# Patient Record
Sex: Female | Born: 1965 | Race: White | Hispanic: No | Marital: Married | Smoking: Former smoker
Health system: Southern US, Community
[De-identification: ages and names within clinical notes are randomized; demographics above are authoritative.]

## PROBLEM LIST (undated history)

## (undated) DIAGNOSIS — M797 Fibromyalgia: Secondary | ICD-10-CM

## (undated) DIAGNOSIS — M519 Unspecified thoracic, thoracolumbar and lumbosacral intervertebral disc disorder: Secondary | ICD-10-CM

## (undated) DIAGNOSIS — K224 Dyskinesia of esophagus: Secondary | ICD-10-CM

## (undated) DIAGNOSIS — D649 Anemia, unspecified: Secondary | ICD-10-CM

## (undated) DIAGNOSIS — N893 Dysplasia of vagina, unspecified: Secondary | ICD-10-CM

## (undated) DIAGNOSIS — G43909 Migraine, unspecified, not intractable, without status migrainosus: Secondary | ICD-10-CM

## (undated) HISTORY — PX: HEMORRHOID SURGERY: SHX153

## (undated) HISTORY — DX: Dyskinesia of esophagus: K22.4

## (undated) HISTORY — PX: TUBAL LIGATION: SHX77

## (undated) HISTORY — PX: APPENDECTOMY: SHX54

## (undated) HISTORY — DX: Fibromyalgia: M79.7

## (undated) HISTORY — PX: CERVICAL FUSION: SHX112

## (undated) HISTORY — DX: Unspecified thoracic, thoracolumbar and lumbosacral intervertebral disc disorder: M51.9

## (undated) HISTORY — DX: Migraine, unspecified, not intractable, without status migrainosus: G43.909

## (undated) HISTORY — PX: NASAL SEPTUM SURGERY: SHX37

---

## 1999-04-08 ENCOUNTER — Other Ambulatory Visit: Admission: RE | Admit: 1999-04-08 | Discharge: 1999-04-08 | Payer: Self-pay | Admitting: Obstetrics and Gynecology

## 2000-04-22 ENCOUNTER — Other Ambulatory Visit: Admission: RE | Admit: 2000-04-22 | Discharge: 2000-04-22 | Payer: Self-pay | Admitting: Obstetrics and Gynecology

## 2001-01-05 ENCOUNTER — Other Ambulatory Visit: Admission: RE | Admit: 2001-01-05 | Discharge: 2001-01-05 | Payer: Self-pay | Admitting: Obstetrics and Gynecology

## 2001-06-10 ENCOUNTER — Other Ambulatory Visit: Admission: RE | Admit: 2001-06-10 | Discharge: 2001-06-10 | Payer: Self-pay | Admitting: Obstetrics and Gynecology

## 2001-08-07 ENCOUNTER — Inpatient Hospital Stay (HOSPITAL_COMMUNITY): Admission: AD | Admit: 2001-08-07 | Discharge: 2001-08-10 | Payer: Self-pay | Admitting: Obstetrics and Gynecology

## 2001-08-07 ENCOUNTER — Encounter (INDEPENDENT_AMBULATORY_CARE_PROVIDER_SITE_OTHER): Payer: Self-pay

## 2001-08-12 ENCOUNTER — Encounter (HOSPITAL_COMMUNITY): Admission: RE | Admit: 2001-08-12 | Discharge: 2001-09-11 | Payer: Self-pay | Admitting: Obstetrics and Gynecology

## 2001-09-14 ENCOUNTER — Other Ambulatory Visit: Admission: RE | Admit: 2001-09-14 | Discharge: 2001-09-14 | Payer: Self-pay | Admitting: Obstetrics and Gynecology

## 2002-03-22 ENCOUNTER — Other Ambulatory Visit: Admission: RE | Admit: 2002-03-22 | Discharge: 2002-03-22 | Payer: Self-pay | Admitting: Obstetrics and Gynecology

## 2003-05-04 ENCOUNTER — Other Ambulatory Visit: Admission: RE | Admit: 2003-05-04 | Discharge: 2003-05-04 | Payer: Self-pay | Admitting: Obstetrics and Gynecology

## 2003-12-17 ENCOUNTER — Other Ambulatory Visit: Admission: RE | Admit: 2003-12-17 | Discharge: 2003-12-17 | Payer: Self-pay | Admitting: Obstetrics and Gynecology

## 2004-05-05 ENCOUNTER — Other Ambulatory Visit: Admission: RE | Admit: 2004-05-05 | Discharge: 2004-05-05 | Payer: Self-pay | Admitting: Obstetrics and Gynecology

## 2004-05-27 ENCOUNTER — Ambulatory Visit (HOSPITAL_COMMUNITY): Admission: RE | Admit: 2004-05-27 | Discharge: 2004-05-27 | Payer: Self-pay

## 2004-11-10 ENCOUNTER — Other Ambulatory Visit: Admission: RE | Admit: 2004-11-10 | Discharge: 2004-11-10 | Payer: Self-pay | Admitting: Obstetrics and Gynecology

## 2005-11-26 ENCOUNTER — Encounter
Admission: RE | Admit: 2005-11-26 | Discharge: 2005-11-26 | Payer: Self-pay | Admitting: Physical Medicine and Rehabilitation

## 2006-01-19 ENCOUNTER — Ambulatory Visit (HOSPITAL_COMMUNITY): Admission: RE | Admit: 2006-01-19 | Discharge: 2006-01-20 | Payer: Self-pay | Admitting: Orthopedic Surgery

## 2006-01-19 ENCOUNTER — Encounter (INDEPENDENT_AMBULATORY_CARE_PROVIDER_SITE_OTHER): Payer: Self-pay | Admitting: Specialist

## 2007-06-22 ENCOUNTER — Emergency Department (HOSPITAL_COMMUNITY): Admission: EM | Admit: 2007-06-22 | Discharge: 2007-06-22 | Payer: Self-pay | Admitting: Emergency Medicine

## 2007-08-01 ENCOUNTER — Encounter: Admission: RE | Admit: 2007-08-01 | Discharge: 2007-09-13 | Payer: Self-pay | Admitting: Family Medicine

## 2007-10-10 ENCOUNTER — Encounter: Admission: RE | Admit: 2007-10-10 | Discharge: 2007-10-10 | Payer: Self-pay | Admitting: Neurology

## 2007-10-13 ENCOUNTER — Encounter: Admission: RE | Admit: 2007-10-13 | Discharge: 2007-10-13 | Payer: Self-pay | Admitting: Family Medicine

## 2009-03-07 ENCOUNTER — Encounter: Admission: RE | Admit: 2009-03-07 | Discharge: 2009-03-07 | Payer: Self-pay | Admitting: Obstetrics and Gynecology

## 2010-03-25 ENCOUNTER — Encounter
Admission: RE | Admit: 2010-03-25 | Discharge: 2010-03-25 | Payer: Self-pay | Source: Home / Self Care | Attending: Obstetrics and Gynecology | Admitting: Obstetrics and Gynecology

## 2010-08-01 NOTE — H&P (Signed)
NAMEAMBERLE, LYTER                   ACCOUNT NO.:  [id number]   MEDICAL RECORD NO.:  [id number]          PATIENT TYPE:  AMB   LOCATION:  SDS                          FACILITY:  [facility]   PHYSICIAN:  Lianne Cure, P.A.  DATE OF BIRTH:  03/16/1964   DATE OF ADMISSION:  DATE OF DISCHARGE:                                HISTORY & PHYSICAL   CHIEF COMPLAINT:  Neck pain with bilateral arm pain to the wrist.   HISTORY OF PRESENT ILLNESS:  No other illnesses other than neck pain.   ALLERGIES:  SHE HAS NO KNOWN DRUG ALLERGIES.   CURRENT MEDICATIONS:  Include:  1. Norco 10 one at bedtime.  2. Flexeril 10 one at bedtime.  3. Meloxicam once daily.   PAST SURGICAL HISTORY:  1. C-section.  2. Hemorrhoidectomy.  3. Appendectomy.   SOCIAL HISTORY:  She does smoke about a half a pack of cigarettes a day and  drinks occasionally.   FAMILY HISTORY:  Mother and father are both in good health and living.   REVIEW OF SYSTEMS:  She reports no fevers.  No chills.  No seizures.  No  migraines.  No hemoptysis.  No bleeding tendencies.  No bowel or bladder  incontinence.  No shortness of breath.  No chest pain.   PHYSICAL EXAM:  VITAL SIGNS:  A temperature of 99.6, a pulse of 106,  respirations 20, a saturation of 97 on room air, and a blood pressure  118/86.  GENERAL:  She appears to be a well-developed, well-nourished, normocephalic  in appearance female.  HEENT:  She has pupils that are equal, round, and reactive to light.  NECK:  Supple to palpation, but she is in a guarded posture.  She has  tenderness over the upper trapezius and reports pain and tingling down  bilateral upper extremities to the wrist area on the dorsum of the arms  laterally.  CHEST:  Clear to auscultation bilaterally.  No wheezes are noted.  HEART:  Regular rate and rhythm.  No murmurs noted.  ABDOMEN:  Soft, nontender to palpation.  Positive bowel sounds elicited on  auscultation.  EXTREMITY EXAM:  Bilateral lower  extremities are neurovascularly and motor  intact.  Bilateral upper extremities decreased sensation mainly on the left  in the division of C7 nerve root.  She had positive Hoffman's sign  bilaterally and pain radiates down bilateral arms to the wrists on the  dorsal lateral aspect of her arm.  SKIN:  Clean and dry.  No rashes.   MRI was reviewed.  She has a central lift and paracentral lift disk  protrusion at C5-C6.   IMPRESSION:  Positive Hoffman sign, central herniated disk C5-C6.   PLAN:  Anterior cervical fusion C5-C6 by Dr. Nelda Severe, with assistant  Lianne Cure, PA-C, at [facility].      Lianne Cure, P.A.     MC/MEDQ  D:  01/15/2006  T:  01/16/2006  Job:  [id number]

## 2010-08-01 NOTE — Op Note (Signed)
[name], CADAVID                   ACCOUNT NO.:  [id number]   MEDICAL RECORD NO.:  [id number]          PATIENT TYPE:  AMB   LOCATION:  SDS                          FACILITY:  [facility]   PHYSICIAN:  Nelda Severe, MD      DATE OF BIRTH:  January 24, 1966   DATE OF PROCEDURE:  01/19/2006  DATE OF DISCHARGE:                                 OPERATIVE REPORT   SURGEON:  Dr. Nelda Severe.   ASSISTANT:  Lianne Cure, PA-C   PREOPERATIVE DIAGNOSIS:  C5-6 disk herniation.   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   OPERATIVE PROCEDURE:  Anterior C5-6 decompression; anterior C5-6 interbody  fusion; C5-6, insertion of intervertebral cage with autogenous bone graft;  harvest autogenous bone graft, left anterior iliac crest; application of  four-hole anterior titanium plate O5-3 (Invictus).   CLINICAL NOTE:  This 45 year old woman has a long history of neck stiffness,  upper extremity discomfort and on physical examination, the physical  findings suggestive of myelopathy.  The MRI scan shows an C5-6 disk  herniation more prominent on the left side.  There does not appear to be  severe cord compression, although there is contact with the cord.   OPERATIVE PROCEDURE:  The patient was placed under general endotracheal  anesthesia.  One gram of Ancef had been infused intravenously.  She was  positioned supine using a Mayfield horseshoe type of head support for the  occiput.  The neck was relatively extended.  There was a folded towel under  the scapulae.  There also was a bump under the left buttock to raise the  left anterior iliac crest for bone graft harvest.   The anterior cervical area was prepped with DuraPrep as was the left  anterior iliac crest.  Square draping was applied and secured with Ioban.   A transverse incision was made to the left side, midline to the anterior  border  of the sternocleidomastoid muscle at the level of the carotid  tubercle which was easily palpable.  The skin was scored and then the  subcutaneous layer and dermis injected with a mixture of 0.25% Marcaine with  epinephrine and 1% lidocaine with epinephrine using a 25 gauge needle.  Incision was then deepened to the subcutaneous layer and the platysma cut in  line with the incision using cutting current.   The interval into the deep cervical fascia anterior to the  sternocleidomastoid and medial to the strap muscles was bluntly dissected.  The carotid sheath was identified.  The esophagus and trachea retracted to  the right side and the longus colli muscles identified.  The C5-6 level  appeared to have relatively prominent anterior spondylophyte.  A needle was  placed in that level, bent in a Z shape, and a cross-table lateral  radiograph and taken, confirming level at C5-6.  This was also confirmed by  Dr. Mariam Dollar, radiologist.   The longus coli muscle was mobilized at the C5-6 level proximally to the C4-  5 disk and distally to  the C6-7 disk bilaterally.  The Shadow Lite self-  retaining blade and retractors were then inserted with the blades deep to  the longus coli muscles bilaterally.  Retraction was then applied.   Caspar distraction pins were then placed above and below the disk space,  taking care to place the C6 pin proximal to the subjacent disk and the C5  pins subjacent to the C4-5 disk.  The annulus was then incised in  rectangular fashion and the nucleus curetted free and about 75% the disk  removed in one piece.  Distraction was then applied.   Meticulous curettage of the endplates was carried out.  Curettage was then  carried back through the posterior annulus to until the posterior  longitudinal ligament fibers could be identified.  The 2-mm Kerrison rongeur  was used to undercut the superior posterior corner of C6 below and the  posterior inferior corner of C5 above  from neural foramen to neural foramen.  Eventually the posterior longitudinal ligament was excised and the dura  identified.  All disk material was cleared, although I was not entirely sure  whether the disk herniation really did extrude through the posterior  longitudinal ligament or not.  Any event the canal and neural foramen at  both sides were probed with a nerve hook, no loose fragments identified and  there appeared to be good decompression.   At this point we relaxed the retraction and distraction and harvested an  anterior left iliac crest graft.  A short incision was made about a  centimeter distal to the crest through the skin and then by displacing the  skin proximally, incision carried down onto the iliac crest.  Iliac crest on  the superior surface was fenestrated using a quarter inch osteotomes to  create a defect about 8 mm x 10 mm.  A curette was then used to remove  cancellous bone from between the inner and outer tables of the ilium,  sufficient to fill a interbody cage (Aesculap).   The disk space was sized with trials and 7 mm implant chosen.  This was  packed with bone graft.  The distraction was increased a little and the cage  gently tapped into place and countersunk about 2 mm.  The distraction was  then let off and the pins removed.   A four-hole anterior plate was then applied using 12 mm deep self-drilling  screws (Invictus).   Cross-table lateral radiograph was taken which showed the plate in good  position at C5-6.  The screws were checked to make sure that they had locked  and they had.  We then placed a quarter inch Penrose drain in the depths of  the wound bringing it out through the incision medially.  The platysma was  closed using interrupted inverted 0 Vicryl sutures.  The subcutaneous tissue  was closed using 2-0 inverted Vicryl sutures and the skin closed using a continuous subcuticular 3-0 Vicryl suture.  The Penrose drain was stitched  in place  with a 4-0 nylon suture.   The external oblique fascia at the iliac crest wound was closed using  interrupted 0 Vicryl suture.  The subcutaneous tissue was closed using 2-0  Vicryl suture in interrupted fashion.  The skin was closed using a  subcuticular 3-0 undyed Vicryl suture.   Prior to closing the wound, in fact after harvesting the graft, the defect  in the iliac crest was packed with thrombin-soaked Gelfoam.  The wounds were  reinforced using Steri-Strips.  Nonadherent antibiotic dressing was applied  to the iliac crest wound and secured with OpSite.  The cervical wound was  dressed with gauze and the gauze secured using a towel around the neck.  No  collar was placed at this time.   At time of dictation the patient has not awakened and so no neurologic  examination is reported here.  The sponge, needle counts were correct.  There were no intraoperative complications.  The blood loss estimated at 25  mL.      Nelda Severe, MD  Electronically Signed     MT/MEDQ  D:  01/19/2006  T:  01/20/2006  Job:  [id number]

## 2010-08-01 NOTE — Op Note (Signed)
[name], [name]                   ACCOUNT NO.:  [id number]   MEDICAL RECORD NO.:  [id number]          PATIENT TYPE:  AMB   LOCATION:  DAY                          [facility]ACILITY[facility]  [facility]   PHYSICIAN:  Lorre Munroe., M.D.DATE OF BIRTH:  1966/01/05   DATE OF PROCEDURE:  05/27/2004  DATE OF DISCHARGE:                                 OPERATIVE REPORT   PREOPERATIVE DIAGNOSIS:  External hemorrhoids.   POSTOPERATIVE DIAGNOSIS:  External hemorrhoids.   OPERATION:  Complete external hemorrhoidectomy.   SURGEON:  Dr. Orson Slick.   ANESTHESIA:  General and local.   PROCEDURE:  After the patient was monitored and had general anesthesia and  routine preparation and draping of the peritoneum, a very liberally infused  long-acting local anesthetic into the perianal skin and the underlying  musculature.  I had done a thorough exam in the office and did not believe  that there was any problem with internal hemorrhoids.  There were four  prominent skin tags in the perianal skin.  I treated each of these by  clamping across it with a hemostat, amputating the hemorrhoid, and suturing  it with running 3-0 chromic stitch.  Hemostasis was not a problem.  I  examined the anal canal and found there was no stenosis.  I injected a  little bit more local anesthetic and concluded the procedure.  She tolerated  it well.      WB/MEDQ  D:  05/27/2004  T:  05/27/2004  Job:  [id number]

## 2010-08-01 NOTE — Op Note (Signed)
[facility]  Patient:    [name], [name] Visit Number: [id number] MRN: [id number]          Service Type: OBS Location: 910A 9105 01 Attending Physician:  Donne Hazel Dictated by:   Willey Blade, M.D. Proc. Date: 08/07/01 Admit Date:  08/07/2001                             Operative Report  PREOPERATIVE DIAGNOSIS:       1. Intrauterine pregnancy at term.                               2. Cephalopelvic disproportion.                               3. Maternal fever, consistent with]                                  chorioamnionitis.                               4. Voluntary sterilization.  POSTOPERATIVE DIAGNOSIS:      1. Intrauterine pregnancy at term.                               2. Cephalopelvic disproportion.                               3. Maternal fever, consistent with                                  chorioamnionitis.                               4. Voluntary sterilization.   PROCEDURES:                   1. Primary low transverse cesarean section.                               2. Bilateral tubal ligation.   SURGEON:                      Willey Blade, M.D.  ANESTHESIA:                   Epidural.  ESTIMATED BLOOD LOSS:         800 cc.  COMPLICATIONS:                None.  FINDINGS:                     At 2238h through a low transverse uterine incision, a viable female infant was delivered from the vertex presentation. The babys weight was a female weighing 9 pounds 6 ounces.  Apgars 8 and 9.  A bilateral tubal ligation was performed at the patients request.  The pelvis was otherwise visualized and noted to be  normal.  DESCRIPTION OF PROCEDURE:     The patient was taken to the operating room, where an adequate level of epidural anesthetic was administered.  The patient was then placed on the operating room table in the left lateral tilt position.  The abdomen was prepped and draped in the usual sterile fashion with  Betadine and sterile drapes.  A Foley catheter had previously been inserted.  The abdomen was entered through a Pfannenstiel incision and carried down sharply in the usual fashion.  The peritoneum was atraumatically entered. The vesicouterine peritoneum overlying the lower uterine segment was incised, and a bladder flap was bluntly and sharply created over the lower uterine segment.  A bladder blade was then placed behind the bladder flap.  The uterus was then entered through a low transverse incision and carried out laterally using the operators fingers.  The membranes were entered, with clear fluid noted.  The vertex was noted to be in a straight OP position.  The vertex was elevated into the incision, and delivered promptly and easily at 10:38 pm.  The oronasopharynx was thoroughly bulb suctioned, and the cord doubly clamped and cut.  The baby was handed promptly to the pediatricians.  The baby did well. Apgars were 8 and 9.  The baby was a female weighing 9 pounds 6 ounces.  The placenta was then manually extracted intact with a three-vessel cord without difficulty.  The interior of the uterus was wiped clean with a wet sponge thoroughly.  The uterine incision was then closed in a two-layer fashion, the first layer a running interlocking suture of #1 Vicryl.  The second embrocating suture was placed across the primary suture line with a running stitch of #1 Vicryl as well.  Good hemostasis was noted.  Bilateral tubal ligation was then performed at the patients request.  First the right tube was identified and traced to its fimbriated end, to ensure its positive identification.  Through an avascular region of the of the mesosalpinx, two strands of 0 plain suture were passed, and approximately a 3 cm segment of tube was tied off using the two strands of 0 plain.  An approximately 2.5 cm segment of tube was then excised between these two existing plain ligatures.  This was done about 3 cm  away from the uterine fundus.  A single ligature of 0 silk was placed on the medial tubule stump. Good hemostasis was noted.  The same procedure was repeated on the left tube.  The rectus muscle and anterior peritoneum was then closed with a running stitch of #1 Vicryl.  The subfascial areas were hemostatic.  The fascia was then closed with a running stitch of #1 Vicryl.  Good fascial reapproximation was noted.  The subcutaneous tissue was irrigated and made hemostatic using Bovie cautery.  The skin reapproximated with staples, and a sterile dressing applied.  Final sponge, needle and instrument count was correct x3.  The patient received intravenous ampicillin and gentamicin prior to her surgery. Dictated by:   Willey Blade, M.D. Attending Physician:  Donne Hazel DD:  08/07/01 TD:  08/09/01 Job: [id number]

## 2010-08-01 NOTE — Discharge Summary (Signed)
[facility]  Patient:    [name], [name] Visit Number: [id number] MRN: [id number]          Service Type: OBS Location: 910A 9105 01 Attending Physician:  Donne Hazel Dictated by:   Julio Sicks, N.P. Admit Date:  08/07/2001 Discharge Date: 08/10/2001                             Discharge Summary  ADMISSION DIAGNOSES:          1. Intrauterine pregnancy at term.                               2. Cephalopelvic disproportion.                               3. Maternal fever consistent with                                  chorioamnionitis.                               4. Voluntary sterilization.  DISCHARGE DIAGNOSES:          1. Primary low transverse cesarean delivery.                               2. Viable female infant.                               3. Permanent sterilization.  PROCEDURE:                    1. Primary low transverse cesarean section.                               2. Bilateral tubal ligation.  REASON FOR ADMISSION:         Please see written H&P.  HOSPITAL COURSE:              The patient was admitted at term with spontaneous rupture of membranes with clear fluid.  Fetal heart tones were reassuring.  Cervix was noted to be 1 cm dilated.  Fetus was in the vertex presentation.  Pitocin augmentation was started.  Epidural was placed for pain control.  The patient progressed to 8 cm dilated.  Maternal temperature was noted to be elevated with fetal tachycardia noted in the 170s to 180s.  IV antibiotics were administered.  Decision was made to proceed with a low transverse cesarean delivery.  The patient was taken to the operating room for the above noted procedure.  Epidural was dosed to the adequate level.  A low transverse incision was made with the delivery of a viable female infant weighing 9 pounds and 6 ounces with Apgars of 8 at one minute and 9 at five minutes.  Bilateral tubal ligation was performed.  The patient tolerated  the procedure well and was taken to the recovery room.  On postoperative day one, abdomen was soft, nontender.  Abdominal dressing was clean, dry and intact. Lungs were clear to  auscultation.  Labs revealed a hemoglobin of 9.2 and WBC count of 17.5.  On postoperative day two, the patient was noted to have good return of bowel function.  She was tolerating a regular diet without complaints of nausea and vomiting.  Labs revealed a hemoglobin of 9.7 and WBC of 15.9.  IV heparin lock was discontinued. On postoperative day three, the patient complained of small oozing that was noted from the incision site post lifting her suitcase.  The patient was teary, voiced concerns regarding baby with congenital heart defect.  Abdominal was soft.  Incision was clean, dry and intact, without evidence of active bleeding.  Staples were removed. Discussed with the patient regarding trial of SSRI.  The patient declined. The patient was discharged home.  CONDITION ON DISCHARGE:       Good.  DIET:                         Regular as tolerated.  ACTIVITY:                     No heavy lifting times two weeks.  No vaginal entry.  No driving times two weeks.  FOLLOW-UP:                    The patient is to follow up in the office in two weeks for an incision check.  She is to call for temperature greater than 100 degrees, persistent nausea and vomiting, heavy vaginal bleeding, and/or redness or drainage from the incision site.  DISCHARGE MEDICATIONS:        1. Percocet 5/325, #30, one p.o. q.4-6h.                               2. Citracal prenatal vitamins one p.o. q.d.                               3. Motrin 600 mg over the counter q.6h. p.r.n. Dictated by:   Julio Sicks, N.P. Attending Physician:  Donne Hazel DD:  08/24/01 TD:  08/26/01 Job: [id number]

## 2012-07-21 ENCOUNTER — Encounter: Payer: Self-pay | Admitting: Neurology

## 2012-07-21 DIAGNOSIS — R519 Headache, unspecified: Secondary | ICD-10-CM | POA: Insufficient documentation

## 2012-07-21 DIAGNOSIS — M797 Fibromyalgia: Secondary | ICD-10-CM

## 2012-07-21 DIAGNOSIS — M519 Unspecified thoracic, thoracolumbar and lumbosacral intervertebral disc disorder: Secondary | ICD-10-CM

## 2012-07-25 ENCOUNTER — Encounter: Payer: Self-pay | Admitting: Neurology

## 2012-07-25 ENCOUNTER — Ambulatory Visit (INDEPENDENT_AMBULATORY_CARE_PROVIDER_SITE_OTHER): Payer: BC Managed Care – PPO | Admitting: Neurology

## 2012-07-25 VITALS — BP 122/71 | HR 96 | Ht 63.0 in | Wt 113.2 lb

## 2012-07-25 DIAGNOSIS — M79609 Pain in unspecified limb: Secondary | ICD-10-CM
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
## 2012-07-26 LAB — FOLATE: Folate: 15.3 ng/mL (ref >3.0)

## 2012-07-26 LAB — VITAMIN B12: Vitamin B-12: 568 pg/mL (ref 211–946)

## 2012-07-26 LAB — ACETYLCHOLINE RECEPTOR, BINDING: AChR Binding Ab, Serum: <0.03 nmol/L (ref 0.00–0.24)

## 2012-07-27 NOTE — Progress Notes (Signed)
Quick Note:  I have called left message for her, only abnormality is Positive ANA, rest lab was normal. ______

## 2012-07-28 ENCOUNTER — Ambulatory Visit (INDEPENDENT_AMBULATORY_CARE_PROVIDER_SITE_OTHER): Payer: BC Managed Care – PPO

## 2012-07-28 DIAGNOSIS — M79609 Pain in unspecified limb: Secondary | ICD-10-CM

## 2012-11-07 ENCOUNTER — Telehealth: Payer: Self-pay | Admitting: Neurology

## 2012-11-07 NOTE — Telephone Encounter (Signed)
Called and left message about appointment that she cx with Dr.Yan left message I would let Dr.Yan no that she cx Dr.Yan out of the office until 11-10-2012.

## 2012-11-10 ENCOUNTER — Ambulatory Visit: Payer: BC Managed Care – PPO | Admitting: Neurology

## 2014-06-06 ENCOUNTER — Other Ambulatory Visit: Payer: Self-pay | Admitting: Obstetrics and Gynecology

## 2014-06-07 LAB — CYTOLOGY - PAP

## 2015-06-26 DIAGNOSIS — N760 Acute vaginitis: Secondary | ICD-10-CM | POA: Diagnosis not present

## 2015-06-26 DIAGNOSIS — K5900 Constipation, unspecified: Secondary | ICD-10-CM | POA: Diagnosis not present

## 2015-06-26 DIAGNOSIS — Z01419 Encounter for gynecological examination (general) (routine) without abnormal findings: Secondary | ICD-10-CM | POA: Diagnosis not present

## 2015-06-26 DIAGNOSIS — Z1231 Encounter for screening mammogram for malignant neoplasm of breast: Secondary | ICD-10-CM | POA: Diagnosis not present

## 2015-06-26 DIAGNOSIS — Z681 Body mass index (BMI) 19 or less, adult: Secondary | ICD-10-CM | POA: Diagnosis not present

## 2015-06-26 DIAGNOSIS — K641 Second degree hemorrhoids: Secondary | ICD-10-CM | POA: Diagnosis not present

## 2015-06-28 ENCOUNTER — Other Ambulatory Visit: Payer: Self-pay | Admitting: Obstetrics and Gynecology

## 2015-06-28 DIAGNOSIS — R928 Other abnormal and inconclusive findings on diagnostic imaging of breast: Secondary | ICD-10-CM

## 2015-07-05 ENCOUNTER — Ambulatory Visit
Admission: RE | Admit: 2015-07-05 | Discharge: 2015-07-05 | Disposition: A | Discharge status: Home / Self Care | Payer: BLUE CROSS/BLUE SHIELD | Source: Ambulatory Visit | Attending: Obstetrics and Gynecology | Admitting: Obstetrics and Gynecology

## 2015-07-05 DIAGNOSIS — N63 Unspecified lump in breast: Secondary | ICD-10-CM | POA: Diagnosis not present

## 2015-07-05 DIAGNOSIS — R928 Other abnormal and inconclusive findings on diagnostic imaging of breast: Secondary | ICD-10-CM

## 2015-07-05 DIAGNOSIS — N6012 Diffuse cystic mastopathy of left breast: Secondary | ICD-10-CM | POA: Diagnosis not present

## 2015-09-05 DIAGNOSIS — J209 Acute bronchitis, unspecified: Secondary | ICD-10-CM | POA: Diagnosis not present

## 2015-09-30 DIAGNOSIS — M79641 Pain in right hand: Secondary | ICD-10-CM | POA: Diagnosis not present

## 2015-09-30 DIAGNOSIS — M797 Fibromyalgia: Secondary | ICD-10-CM | POA: Diagnosis not present

## 2015-09-30 DIAGNOSIS — R5381 Other malaise: Secondary | ICD-10-CM | POA: Diagnosis not present

## 2015-09-30 DIAGNOSIS — M79671 Pain in right foot: Secondary | ICD-10-CM | POA: Diagnosis not present

## 2015-09-30 DIAGNOSIS — E559 Vitamin D deficiency, unspecified: Secondary | ICD-10-CM | POA: Diagnosis not present

## 2015-09-30 DIAGNOSIS — G4709 Other insomnia: Secondary | ICD-10-CM | POA: Diagnosis not present

## 2015-09-30 DIAGNOSIS — Z79899 Other long term (current) drug therapy: Secondary | ICD-10-CM | POA: Diagnosis not present

## 2015-10-28 DIAGNOSIS — J309 Allergic rhinitis, unspecified: Secondary | ICD-10-CM | POA: Diagnosis not present

## 2015-10-28 DIAGNOSIS — J329 Chronic sinusitis, unspecified: Secondary | ICD-10-CM | POA: Diagnosis not present

## 2016-01-20 ENCOUNTER — Other Ambulatory Visit: Payer: Self-pay | Admitting: Rheumatology

## 2016-01-20 NOTE — Telephone Encounter (Signed)
Ok to refill Tramadol.

## 2016-01-20 NOTE — Telephone Encounter (Signed)
Last visit 09/30/15 Next visit 04/02/16 UDS 04/01/15 09/30/15 narcotic agreement  Ok to refill Tramadol ?

## 2016-01-27 ENCOUNTER — Other Ambulatory Visit: Payer: Self-pay | Admitting: Rheumatology

## 2016-01-27 NOTE — Telephone Encounter (Signed)
Last visit 09/30/15 Next visit 04/03/15 Ok to refill per Dr Estanislado Pandy

## 2016-03-07 ENCOUNTER — Other Ambulatory Visit: Payer: Self-pay | Admitting: Rheumatology

## 2016-03-11 NOTE — Telephone Encounter (Signed)
09/30/15 last visit  Message sent for appt sch Ok to refill per Dr Estanislado Pandy  One month supply

## 2016-03-15 ENCOUNTER — Other Ambulatory Visit: Payer: Self-pay | Admitting: Rheumatology

## 2016-03-17 ENCOUNTER — Telehealth: Payer: Self-pay | Admitting: Rheumatology

## 2016-03-17 NOTE — Telephone Encounter (Signed)
-----   Message from Candice Camp, RT sent at 03/17/2016  9:06 AM EST ----- Needs appt Jan

## 2016-03-17 NOTE — Telephone Encounter (Signed)
Last visit 09/20/15 Next visit not scheduled,  Message sent to front desk for appt scheduling Ok to refill per Dr Estanislado Pandy  One month supply

## 2016-03-17 NOTE — Telephone Encounter (Signed)
LMOM for patient to call back to schedule appointment.  

## 2016-03-24 ENCOUNTER — Other Ambulatory Visit: Payer: Self-pay | Admitting: Rheumatology

## 2016-03-24 ENCOUNTER — Other Ambulatory Visit: Payer: Self-pay | Admitting: *Deleted

## 2016-03-24 NOTE — Telephone Encounter (Signed)
Last Visit: 09/30/15 Next Visit: 04/08/16 UDS: 04/01/15 Narc Agreement: 09/30/15  Okay to refill Tramadol?

## 2016-04-01 ENCOUNTER — Ambulatory Visit: Payer: Self-pay | Admitting: Rheumatology

## 2016-04-08 ENCOUNTER — Encounter: Payer: Self-pay | Admitting: Rheumatology

## 2016-04-08 ENCOUNTER — Other Ambulatory Visit: Payer: Self-pay | Admitting: Rheumatology

## 2016-04-08 ENCOUNTER — Ambulatory Visit (INDEPENDENT_AMBULATORY_CARE_PROVIDER_SITE_OTHER): Payer: BLUE CROSS/BLUE SHIELD | Admitting: Rheumatology

## 2016-04-08 VITALS — BP 112/60 | HR 76 | Ht 63.0 in | Wt 107.0 lb

## 2016-04-08 DIAGNOSIS — M797 Fibromyalgia: Secondary | ICD-10-CM | POA: Diagnosis not present

## 2016-04-08 DIAGNOSIS — M19042 Primary osteoarthritis, left hand: Secondary | ICD-10-CM

## 2016-04-08 DIAGNOSIS — M5136 Other intervertebral disc degeneration, lumbar region: Secondary | ICD-10-CM

## 2016-04-08 DIAGNOSIS — M19049 Primary osteoarthritis, unspecified hand: Secondary | ICD-10-CM | POA: Insufficient documentation

## 2016-04-08 DIAGNOSIS — M19041 Primary osteoarthritis, right hand: Secondary | ICD-10-CM | POA: Diagnosis not present

## 2016-04-08 DIAGNOSIS — M519 Unspecified thoracic, thoracolumbar and lumbosacral intervertebral disc disorder: Secondary | ICD-10-CM

## 2016-04-08 DIAGNOSIS — Z8669 Personal history of other diseases of the nervous system and sense organs: Secondary | ICD-10-CM

## 2016-04-08 DIAGNOSIS — M7712 Lateral epicondylitis, left elbow: Secondary | ICD-10-CM

## 2016-04-08 DIAGNOSIS — Z5181 Encounter for therapeutic drug level monitoring: Secondary | ICD-10-CM | POA: Diagnosis not present

## 2016-04-08 DIAGNOSIS — M5030 Other cervical disc degeneration, unspecified cervical region: Secondary | ICD-10-CM | POA: Diagnosis not present

## 2016-04-08 MED ORDER — LIDOCAINE HCL 1 % IJ SOLN
1.0000 mL | INTRAMUSCULAR | Status: AC | PRN
  Administered 2016-04-08: 1 mL

## 2016-04-08 MED ORDER — LIDOCAINE HCL 1 % IJ SOLN
0.5000 mL | INTRAMUSCULAR | Status: AC | PRN
  Administered 2016-04-08: .5 mL

## 2016-04-08 MED ORDER — TRIAMCINOLONE ACETONIDE 40 MG/ML IJ SUSP
20.0000 mg | INTRAMUSCULAR | Status: AC | PRN
  Administered 2016-04-08: 20 mg via INTRA_ARTICULAR

## 2016-04-08 NOTE — Patient Instructions (Signed)
Generic Elbow Exercises EXERCISES RANGE OF MOTION (ROM) AND STRETCHING EXERCISES  These exercises may help you when beginning to rehabilitate your injury. Your symptoms may go away with or without further involvement from your physician, physical therapist or athletic trainer. While completing these exercises, remember:   Restoring tissue flexibility helps normal motion to return to the joints. This allows healthier, less painful movement and activity.  An effective stretch should be held for at least 30 seconds.  A stretch should never be painful. You should only feel a gentle lengthening or release in the stretched tissue. RANGE OF MOTION - Extension   Hold your right / left arm at your side and straighten your elbow as far as you can, using your right / left arm muscles.  Straighten the elbow farther by gently pushing down on your forearm until you feel a gentle stretch on the inside of your elbow. Hold this position for __________ seconds.  Slowly return to the starting position. Repeat __________ times. Complete this exercise __________ times per day.  RANGE OF MOTION - Flexion   Hold your right / left arm at your side and bend your elbow as far as you can using your arm muscles.  Bend the right / left elbow farther by gently pushing up on your forearm until you feel a gentle stretch on the outside of your elbow. Hold this position for __________ seconds.  Slowly return to the starting position. Repeat __________ times. Complete this exercise __________ times per day.  RANGE OF MOTION - Supination, Active   Stand or sit with your elbows at your side. Bend your right / left elbow to 90 degrees.  Turn your palm upward until you feel a gentle stretch on the inside of your forearm.  Hold this position for __________ seconds. Slowly release and return to the starting position. Repeat __________ times. Complete this stretch __________ times per day.  RANGE OF MOTION - Pronation,  Active   Stand or sit with your elbows at your side. Bend your right / left elbow to 90 degrees.  Turn your palm downward until you feel a gentle stretch on the top of your forearm.  Hold this position for __________ seconds. Slowly release and return to the starting position. Repeat __________ times. Complete this stretch __________ times per day.  STRETCH - Elbow Flexors   Lie on a firm bed or countertop, on your back. Be sure that you are in a comfortable position which will allow you to relax your arm muscles.  Place a folded towel under your right / left upper arm, so that your elbow and shoulder are at the same height. Extend your arm; your elbow should not rest on the bed or towel  Allow the weight of your hand to straighten your elbow. Keep your arm and chest muscles relaxed. Your caregiver may ask you to increase the intensity of your stretch by adding a small wrist or hand weight.  Hold for __________ seconds. You should feel a stretch on the inside of your elbow. Slowly return to the starting position. Repeat __________ times. Complete this exercise __________ times per day. STRENGTHENING EXERCISES  These exercises may help you when beginning to rehabilitate your injury. They may resolve your symptoms with or without further involvement from your physician, physical therapist or athletic trainer. While completing these exercises, remember:   Muscles can gain both the endurance and the strength needed for everyday activities through controlled exercises.  Complete these exercises as instructed by  your physician, physical therapist or athletic trainer. Increase the resistance and repetitions only as guided.  You may experience muscle soreness or fatigue, but the pain or discomfort you are trying to eliminate should never worsen during these exercises. If this pain does get worse, stop and make sure you are following the directions exactly. If the pain is still present after  adjustments, discontinue the exercise until you can discuss the trouble with your caregiver. STRENGTH - Elbow Flexors, Isometric   Stand or sit upright on a firm surface. Place your right / left arm so that your hand is palm-up and at the height of your waist.  Place your opposite hand on top of your forearm. Gently push down as your right / left arm resists. Push as hard as you can with both arms without causing any pain or movement at your right / left elbow. Hold this stationary position for __________ seconds.  Gradually release the tension in both arms. Allow your muscles to relax completely before repeating. Repeat __________ times. Complete this exercise __________ times per day.  STRENGTH - Elbow Extensors, Isometric   Stand or sit upright on a firm surface. Place your right / left arm so that your palm faces your abdomen and it is at the height of your waist.  Place your opposite hand on the underside of your forearm. Gently push up as your right / left arm resists. Push as hard as you can with both arms without causing any pain or movement at your right / left elbow. Hold this stationary position for __________ seconds.  Gradually release the tension in both arms. Allow your muscles to relax completely before repeating. Repeat __________ times. Complete this exercise __________ times per day.  STRENGTH - Elbow Flexors, Supinated   With good posture, stand, or sit on a firm chair without armrests. Allow your right / left arm to rest at your side with your palm facing forward.  Holding a __________ weight, or gripping a rubber exercise band or tubing, bring your hand toward your shoulder.  Allow your muscles to control the resistance, as your hand returns to your side. Repeat __________ times. Complete this exercise __________ times per day.  STRENGTH - Elbow Extensors, Dynamic   With good posture, stand, or sit on a firm chair without armrests. Keeping your upper arms at your  side, bring both hands up toward your right / left shoulder while gripping a rubber exercise band or tubing. Your right / left hand should be just below the other hand.  Straighten your right / left elbow. Hold for __________ seconds.  Allow your muscles to control the rubber exercise band, as your hand returns to your shoulder. Repeat __________ times. Complete this exercise __________ times per day.  STRENGTH - Forearm Supinators   Sit with your right / left forearm supported on a table, keeping your elbow below shoulder height. Rest your hand over the edge, palm down.  Gently grip a hammer or a soup ladle.  Without moving your elbow, slowly turn your palm and hand upward to a "thumbs-up" position.  Hold this position for __________ seconds. Slowly return to the starting position. Repeat __________ times. Complete this exercise __________ times per day.  STRENGTH - Forearm Pronators   Sit with your right / left forearm supported on a table, keeping your elbow below shoulder height. Rest your hand over the edge, palm up.  Gently grip a hammer or a soup ladle.  Without moving your elbow, slowly  turn your palm and hand upward to a "thumbs-up" position.  Hold this position for __________ seconds. Slowly return to the starting position. Repeat __________ times. Complete this exercise __________ times per day. This information is not intended to replace advice given to you by your health care provider. Make sure you discuss any questions you have with your health care provider. Document Released: 01/14/2005 Document Revised: 03/23/2014 Document Reviewed: 11/25/2014 Elsevier Interactive Patient Education  2017 [employer].

## 2016-04-08 NOTE — Telephone Encounter (Signed)
Last Visit: 09/30/15 Next Visit: 04/08/16  Okay to refill Tizanidine?

## 2016-04-08 NOTE — Progress Notes (Signed)
Office Visit Note  Patient: [name]             Date of Birth: April 29, 1965           MRN: [id number]             PCP: Gerrit Heck, MD Referring: Leighton Ruff, MD Visit Date: 04/08/2016 Occupation: @GUAROCC @    Subjective:  Left elbow pain   History of Present Illness: [name] is a 51 y.o. female with history of fibromyalgia this disease and osteoarthritis. She states she continues to have some discomfort in her neck and lower back. She's been also having increased headaches lately. She has history of migraine headaches. She has a large dog who usually comes into her and she acquires minor injuries. Recently she's been having pain in her left elbow which she wants evaluated.  Activities of Daily Living:  Patient reports morning stiffness for 2 hours.   Patient Reports nocturnal pain.  Difficulty dressing/grooming: Denies Difficulty climbing stairs: Reports Difficulty getting out of chair: Reports Difficulty using hands for taps, buttons, cutlery, and/or writing: Reports   Review of Systems  Constitutional: Positive for fatigue. Negative for night sweats, weight gain, weight loss and weakness.  HENT: Positive for mouth dryness. Negative for mouth sores, trouble swallowing, trouble swallowing and nose dryness.   Eyes: Positive for dryness. Negative for pain, redness and visual disturbance.  Respiratory: Positive for shortness of breath. Negative for cough and difficulty breathing.   Cardiovascular: Negative for chest pain, palpitations, hypertension, irregular heartbeat and swelling in legs/feet.  Gastrointestinal: Positive for constipation. Negative for blood in stool and diarrhea.  Endocrine: Negative for increased urination.  Genitourinary: Negative for vaginal dryness.  Musculoskeletal: Positive for arthralgias, joint pain, myalgias, morning stiffness and myalgias. Negative for joint swelling, muscle weakness and muscle tenderness.  Skin: Negative for  color change, rash, hair loss, skin tightness, ulcers and sensitivity to sunlight.  Allergic/Immunologic: Negative for susceptible to infections.  Neurological: Negative for dizziness, memory loss and night sweats.  Hematological: Negative for swollen glands.  Psychiatric/Behavioral: Negative for depressed mood and sleep disturbance. The patient is nervous/anxious.     PMFS History:  Patient Active Problem List   Diagnosis Date Noted  . DDD (degenerative disc disease), lumbar 04/08/2016  . DDD (degenerative disc disease), cervical 04/08/2016  . Osteoarthritis, hand 04/08/2016  . Fibromyalgia   . Intervertebral disk disease   . HA (headache)     Past Medical History:  Diagnosis Date  . Fibromyalgia   . Intervertebral disk disease   . Migraine     No family history on file. Past Surgical History:  Procedure Laterality Date  . CERVICAL FUSION    . CESAREAN SECTION     Social History   Social History Narrative   Patient lives at home with her husband and son. She has her BS and she is not working.      Objective: Vital Signs: BP 112/60   Pulse 76   Ht 5\' 3"  (1.6 m)   Wt 107 lb (48.5 kg)   LMP 04/01/2016 (Exact Date)   BMI 18.95 kg/m    Physical Exam  Constitutional: She is oriented to person, place, and time. She appears well-developed and well-nourished.  HENT:  Head: Normocephalic and atraumatic.  Eyes: Conjunctivae and EOM are normal.  Neck: Normal range of motion.  Cardiovascular: Normal rate, regular rhythm, normal heart sounds and intact distal pulses.   Pulmonary/Chest: Effort normal and breath sounds normal.  Abdominal:  Soft. Bowel sounds are normal.  Lymphadenopathy:    She has no cervical adenopathy.  Neurological: She is alert and oriented to person, place, and time.  Skin: Skin is warm and dry. Capillary refill takes less than 2 seconds.  Psychiatric: She has a normal mood and affect. Her behavior is normal.  Nursing note and vitals reviewed.     Musculoskeletal Exam: C-spine limited range of motion, lumbar spine limited range of motion as well. Shoulder joints although joints wrist joints with good range of motion she is some thickening of DIP PIP joints. She is tenderness on palpation over her left lateral epicondyle area. Hip joints knee joints ankles MTPs PIPs were also good range of motion with some discomfort with range of motion of her hip joints.  CDAI Exam: No CDAI exam completed.    Investigation: Findings:  09/30/2015 CBC and CMP normal 03/2015 UDS     Imaging: No results found.  Speciality Comments: No specialty comments available.    Procedures:  Medium Joint Inj Date/Time: 04/08/2016 12:29 PM Performed by: Bo Merino Authorized by: Bo Merino   Consent Given by:  Patient Site marked: the procedure site was marked   Timeout: prior to procedure the correct patient, procedure, and site was verified   Indications:  Pain and joint swelling Location:  Elbow Site:  L lateral epicondyle Prep: patient was prepped and draped in usual sterile fashion   Needle Size:  27 G Needle Length:  1.5 inches Approach:  Posterior Ultrasound Guided: No   Fluoroscopic Guidance: No   Medications:  1 mL lidocaine 1 %; 0.5 mL lidocaine 1 %; 20 mg triamcinolone acetonide 40 MG/ML Aspiration Attempted: Yes   Aspirate amount (mL):  0 Patient tolerance:  Patient tolerated the procedure well with no immediate complications   Allergies: Lyrica [pregabalin]   Assessment / Plan:     Visit Diagnoses: Fibromyalgia: She has generalized pain and discomfort and positive tender points.  Left lateral epicondylitis: She has tried wearing a tennis elbow brace and using Voltaren gel without much relief. She walks her dogs which exacerbate her symptoms. Per her request after informed consent was obtained the area was prepped in sterile fashion and injected with cortisone as described above.  DDD (degenerative disc disease),  lumbar: She has chronic pain for which she takes tramadol.  DDD (degenerative disc disease), cervical: She has limited range of motion and discomfort.  Primary osteoarthritis of both hands: Joint protection and muscle strengthening was discussed.  History of migraine headaches: She reports increased headaches. I've advised her to schedule an appointment with neurologist.  Encounter for therapeutic drug monitoring : We will check her urine drug screen today and also her narcotic agreement was renewed.   Orders: Orders Placed This Encounter  Procedures  . Medium Joint Injection/Arthrocentesis  . Pain Mgmt, Profile 5 w/Conf, U   No orders of the defined types were placed in this encounter.   Face-to-face time spent with patient was 30 minutes. 50% of time was spent in counseling and coordination of care.  Follow-Up Instructions: Return in about 6 months (around 10/06/2016).   Bo Merino, MD  Note - This record has been created using Editor, commissioning.  Chart creation errors have been sought, but may not always  have been located. Such creation errors do not reflect on  the standard of medical care.

## 2016-04-11 LAB — PAIN MGMT, PROFILE 5 W/CONF, U
Amphetamines: NEGATIVE ng/mL (ref <500)
BARBITURATES: NEGATIVE ng/mL (ref <300)
Benzodiazepines: NEGATIVE ng/mL (ref <100)
COCAINE METABOLITE: NEGATIVE ng/mL (ref <150)
CREATININE: 39.3 mg/dL (ref >=20.0)
Codeine: NEGATIVE ng/mL (ref <50)
HYDROCODONE: NEGATIVE ng/mL (ref <50)
HYDROMORPHONE: NEGATIVE ng/mL (ref <50)
MARIJUANA METABOLITE: NEGATIVE ng/mL (ref <20)
MORPHINE: NEGATIVE ng/mL (ref <50)
Methadone Metabolite: NEGATIVE ng/mL (ref <100)
Norhydrocodone: NEGATIVE ng/mL (ref <50)
OPIATES: NEGATIVE ng/mL (ref <100)
OXIDANT: NEGATIVE ug/mL (ref <200)
Oxycodone: NEGATIVE ng/mL (ref <100)
PH: 7.26 (ref 4.5–9.0)

## 2016-04-13 NOTE — Progress Notes (Signed)
C/w tt

## 2016-04-14 ENCOUNTER — Other Ambulatory Visit: Payer: Self-pay | Admitting: Rheumatology

## 2016-04-14 NOTE — Telephone Encounter (Signed)
04/08/16 last visit 10/06/16 next visit  Ok to refill per Dr Estanislado Pandy

## 2016-04-16 DIAGNOSIS — H52223 Regular astigmatism, bilateral: Secondary | ICD-10-CM | POA: Diagnosis not present

## 2016-04-16 DIAGNOSIS — H5203 Hypermetropia, bilateral: Secondary | ICD-10-CM | POA: Diagnosis not present

## 2016-04-16 DIAGNOSIS — H524 Presbyopia: Secondary | ICD-10-CM | POA: Diagnosis not present

## 2016-05-05 ENCOUNTER — Telehealth: Payer: Self-pay | Admitting: Pharmacist

## 2016-05-05 ENCOUNTER — Telehealth (INDEPENDENT_AMBULATORY_CARE_PROVIDER_SITE_OTHER): Payer: Self-pay | Admitting: Orthopaedic Surgery

## 2016-05-05 NOTE — Telephone Encounter (Signed)
I attempted to call patient to review the medication instructions.  I left a message asking patient to call me back.

## 2016-05-05 NOTE — Telephone Encounter (Signed)
Returned patient's call.  See telephone note from [facility].

## 2016-05-05 NOTE — Telephone Encounter (Signed)
Since the patient is taking methocarbamol during the daytime (it only should be 7 or 8 in the morning and 2 or 3 in the afternoon and none at bedtime And then Zanaflex should be only at bedtime, I do not consider to this DUPLICATION  of therapy.

## 2016-05-05 NOTE — Telephone Encounter (Signed)
Patient returned Rachel's phone call.  Cb#[phone].  Thank you.

## 2016-05-05 NOTE — Telephone Encounter (Signed)
Received fax from [place] regarding Therapy Duplication: methocarbamol and tizanidine.  Patient is currently prescribed methocarbamol 500 mg three times daily and tizanidine 4 mg at bedtime.  Please advise.

## 2016-05-05 NOTE — Telephone Encounter (Signed)
I spoke to patient.  She reports she is currently taking methocarbamol 500 mg three times daily (with breakfast, lunch, supper) and tizanidine at bedtime.  She confirms she is no longer taking cyclobenzaprine.  I reviewed the purpose, proper use, and adverse effects of methocarbamol and tizanidine.  Reviewed Mr. Gardiner Ramus instructions to take methocarbamol at 7 or 8 AM and 2 to 3 PM then none at bedtime when she takes the tizanidine.  I advised they are both muscle relaxers and should not be taken together.  Also discussed importance of spacing her doses throughout the day as instructed above.  Patient was not happy about reducing her methocarbamol to twice daily, but voiced understanding.    Elisabeth Most, Pharm.D., BCPS, CPP Clinical Pharmacist Pager: [phone] Phone: [phone] 05/05/2016 4:49 PM

## 2016-05-14 ENCOUNTER — Other Ambulatory Visit: Payer: Self-pay | Admitting: Rheumatology

## 2016-05-14 NOTE — Telephone Encounter (Signed)
Last Visit: 04/08/16 Next visit: 10/06/16  Okay to refill Tizanidine?  

## 2016-05-14 NOTE — Telephone Encounter (Signed)
ok 

## 2016-05-19 ENCOUNTER — Other Ambulatory Visit: Payer: Self-pay | Admitting: Rheumatology

## 2016-05-19 NOTE — Telephone Encounter (Signed)
ok 

## 2016-05-19 NOTE — Telephone Encounter (Signed)
Last Visit: 04/08/16 Next Visit: 10/06/16  Okay to refill Robaxin?

## 2016-06-08 ENCOUNTER — Other Ambulatory Visit: Payer: Self-pay | Admitting: Rheumatology

## 2016-06-08 NOTE — Telephone Encounter (Signed)
ok 

## 2016-06-08 NOTE — Telephone Encounter (Addendum)
Last Visit: 04/08/16 Next visit: 10/06/16 UDS: 04/01/15 Narc Agreement: 09/30/15  Okay to refill Tramadol?

## 2016-06-08 NOTE — Telephone Encounter (Addendum)
Last Visit: 04/08/16 Next visit: 10/06/16  Okay to refill Tizanidine?

## 2016-06-29 ENCOUNTER — Other Ambulatory Visit: Payer: Self-pay | Admitting: Obstetrics and Gynecology

## 2016-06-29 DIAGNOSIS — Z1231 Encounter for screening mammogram for malignant neoplasm of breast: Secondary | ICD-10-CM

## 2016-06-30 DIAGNOSIS — Z681 Body mass index (BMI) 19 or less, adult: Secondary | ICD-10-CM | POA: Diagnosis not present

## 2016-06-30 DIAGNOSIS — Z01419 Encounter for gynecological examination (general) (routine) without abnormal findings: Secondary | ICD-10-CM | POA: Diagnosis not present

## 2016-07-15 ENCOUNTER — Encounter (INDEPENDENT_AMBULATORY_CARE_PROVIDER_SITE_OTHER): Payer: Self-pay

## 2016-07-15 ENCOUNTER — Ambulatory Visit
Admission: RE | Admit: 2016-07-15 | Discharge: 2016-07-15 | Disposition: A | Discharge status: Home / Self Care | Payer: BLUE CROSS/BLUE SHIELD | Source: Ambulatory Visit | Attending: Obstetrics and Gynecology | Admitting: Obstetrics and Gynecology

## 2016-07-15 DIAGNOSIS — Z1231 Encounter for screening mammogram for malignant neoplasm of breast: Secondary | ICD-10-CM | POA: Diagnosis not present

## 2016-08-05 ENCOUNTER — Other Ambulatory Visit: Payer: Self-pay | Admitting: Rheumatology

## 2016-08-05 NOTE — Telephone Encounter (Signed)
Last Visit: 04/08/16 Next visit: 10/06/16 UDS: 04/08/16 Narc Agreement: 09/30/15  Okay to refill Tramadol?

## 2016-08-05 NOTE — Telephone Encounter (Signed)
ok 

## 2016-08-30 ENCOUNTER — Other Ambulatory Visit: Payer: Self-pay | Admitting: Rheumatology

## 2016-08-31 NOTE — Telephone Encounter (Signed)
ok 

## 2016-08-31 NOTE — Telephone Encounter (Signed)
Last Visit: 04/08/16 Next visit: 10/06/16  Okay to refill Tizanidine?

## 2016-09-19 ENCOUNTER — Other Ambulatory Visit: Payer: Self-pay | Admitting: Rheumatology

## 2016-09-21 NOTE — Telephone Encounter (Signed)
Last Visit: 04/08/16 Next Visit: 10/06/16  Okay to refill per Dr. Estanislado Pandy

## 2016-10-01 ENCOUNTER — Other Ambulatory Visit: Payer: Self-pay | Admitting: Rheumatology

## 2016-10-01 DIAGNOSIS — Z8669 Personal history of other diseases of the nervous system and sense organs: Secondary | ICD-10-CM | POA: Insufficient documentation

## 2016-10-01 DIAGNOSIS — F5101 Primary insomnia: Secondary | ICD-10-CM | POA: Insufficient documentation

## 2016-10-01 DIAGNOSIS — R5383 Other fatigue: Secondary | ICD-10-CM | POA: Insufficient documentation

## 2016-10-01 NOTE — Progress Notes (Signed)
Office Visit Note  Patient: [name]             Date of Birth: 04-21-1965           MRN: [id number]             PCP: Leighton Ruff, MD Referring: Leighton Ruff, MD Visit Date: 10/06/2016 Occupation: @GUAROCC @  #### ####  Subjective: Increased muscle spasms.   History of Present Illness: [name] is a 51 y.o. female with history of fibromyalgia, osteoarthritis and disc disease. According to her since she has decreased her methocarbamol to twice a day instead of 3 times a day dosing she's been experiencing increased muscle spasms and cramps. She is still taking tizanidine at bedtime. She does take Topamax 2 tablets at bedtime 25 mg to relieve her migraines. She's been using tramadol 1 tablet by mouth 3 times a day and occasionally 2 tablets 3 times a day. She reports her pain on scale of 0-10 up to 8 without tramadol and with tramadol it's more manageable at 4.  Activities of Daily Living:  Patient reports morning stiffness for 30 minutes.   Patient Reports nocturnal pain.  Difficulty dressing/grooming: Denies Difficulty climbing stairs: Reports Difficulty getting out of chair: Reports Difficulty using hands for taps, buttons, cutlery, and/or writing: Reports   Review of Systems  Constitutional: Positive for fatigue. Negative for night sweats, weight gain, weight loss and weakness.  HENT: Negative for mouth sores, trouble swallowing, trouble swallowing, mouth dryness and nose dryness.   Eyes: Negative for pain, redness, visual disturbance and dryness.  Respiratory: Negative for cough, shortness of breath and difficulty breathing.   Cardiovascular: Negative for chest pain, palpitations, hypertension, irregular heartbeat and swelling in legs/feet.  Gastrointestinal: Negative for blood in stool, constipation and diarrhea.  Endocrine: Negative for increased urination.  Genitourinary: Negative for vaginal dryness.  Musculoskeletal: Positive for arthralgias, joint pain,  myalgias, morning stiffness and myalgias. Negative for joint swelling, muscle weakness and muscle tenderness.  Skin: Negative for color change, rash, hair loss, skin tightness, ulcers and sensitivity to sunlight.  Allergic/Immunologic: Negative for susceptible to infections.  Neurological: Negative for dizziness, memory loss and night sweats.  Hematological: Negative for swollen glands.  Psychiatric/Behavioral: Positive for sleep disturbance. Negative for depressed mood. The patient is not nervous/anxious.     PMFS History:  Patient Active Problem List   Diagnosis Date Noted  . Other fatigue 10/01/2016  . Primary insomnia 10/01/2016  . History of migraine 10/01/2016  . DDD (degenerative disc disease), lumbar 04/08/2016  . DDD (degenerative disc disease), cervical 04/08/2016  . Osteoarthritis, hand 04/08/2016  . Fibromyalgia   . Intervertebral disk disease   . HA (headache)     Past Medical History:  Diagnosis Date  . Fibromyalgia   . Intervertebral disk disease   . Migraine     Family History  Problem Relation Age of Onset  . Breast cancer Neg Hx    Past Surgical History:  Procedure Laterality Date  . APPENDECTOMY    . CERVICAL FUSION    . CESAREAN SECTION    . HEMORRHOID SURGERY     Social History   Social History Narrative   Patient lives at home with her husband and son. She has her BS and she is not working.      Objective: Vital Signs: BP 90/60 (BP Location: Left Arm, Patient Position: Sitting, Cuff Size: Normal)   Pulse 89   Resp 14   Ht 5\' 3"  (1.6 m)  Wt 108 lb (49 kg)   LMP 09/29/2016   BMI 19.13 kg/m    Physical Exam  Constitutional: She is oriented to person, place, and time. She appears well-developed and well-nourished.  HENT:  Head: Normocephalic and atraumatic.  Eyes: Conjunctivae and EOM are normal.  Neck: Normal range of motion.  Cardiovascular: Normal rate, regular rhythm, normal heart sounds and intact distal pulses.   Pulmonary/Chest:  Effort normal and breath sounds normal.  Abdominal: Soft. Bowel sounds are normal.  Lymphadenopathy:    She has no cervical adenopathy.  Neurological: She is alert and oriented to person, place, and time.  Skin: Skin is warm and dry. Capillary refill takes less than 2 seconds.  Psychiatric: She has a normal mood and affect. Her behavior is normal.  Nursing note and vitals reviewed.    Musculoskeletal Exam: C-spine limited range of motion with the stiffness. Lumbar spine decreased range of motion with his comfort. Shoulder joints elbow joints wrist joint MCPs PIPs DIPs are good range of motion with no synovitis hip joints knee joints ankles MTPs PIPs DIPs with good range of motion with no synovitis. Fibromyalgia tender points were 12 out of 18 positive.  CDAI Exam: No CDAI exam completed.    Investigation: No additional findings.  Imaging: No results found.  Speciality Comments: No specialty comments available.    Procedures:  No procedures performed Allergies: Lyrica [pregabalin]   Assessment / Plan:     Visit Diagnoses: Fibromyalgia -she's having a flare with increased pain and discomfort. She states increases stresses causing increased pain and discomfort She is on Robaxin 500 mg 3 times a day. I had discussion regarding decreasing Robaxin to 500 mg twice a day, Tizanidine 4 mg by mouth daily at bedtime,Tramadol 50 mg 1-2 tablets by mouth 3 times a day. Patient states that she takes only 1 tablet 3 times a day and occasionally takes 2 tablets 3 times a day when flares. He had detailed discussion regarding using this medications as minimal as possible. If tolerable then she try to taper these medications.  Other fatigue: She continues to have fatigue due to fibromyalgia  Primary insomnia: Reports insomnia  DDD (degenerative disc disease), cervical: Ongoing stiffness  DDD (degenerative disc disease), lumbar: Chronic pain. She states she is in constant pain despite  medications  Primary osteoarthritis of both hands: Minimal  History of migraine : Controlled on Topamax. She wants to continue with Topamax 50 mg at bedtime.  Pain management: Her narcotic agreement is due today. Her UDS was January 2018. Side effects of all medications were discussed.  Orders: No orders of the defined types were placed in this encounter.  Meds ordered this encounter  Medications  . methocarbamol (ROBAXIN) 500 MG tablet    Sig: Take 1 tablet (500 mg total) by mouth 2 (two) times daily as needed for muscle spasms.    Dispense:  60 tablet    Refill:  2    Face-to-face time spent with patient was 30 minutes. 50% of time was spent in counseling and coordination of care.  Follow-Up Instructions: Return in about 6 months (around 04/08/2017) for FMS.   Bo Merino, MD  Note - This record has been created using Editor, commissioning.  Chart creation errors have been sought, but may not always  have been located. Such creation errors do not reflect on  the standard of medical care.

## 2016-10-02 NOTE — Telephone Encounter (Signed)
Last Visit: 04/08/16 Next Visit: 10/06/16  Okay to refill per Dr. Estanislado Pandy

## 2016-10-04 ENCOUNTER — Other Ambulatory Visit: Payer: Self-pay | Admitting: Rheumatology

## 2016-10-05 NOTE — Telephone Encounter (Signed)
Last Visit: 04/08/16 Next visit: 10/06/16 UDS: 04/08/16 Narc Agreement: 09/30/15  Okay to refill Tramadol?

## 2016-10-05 NOTE — Telephone Encounter (Signed)
ok 

## 2016-10-06 ENCOUNTER — Encounter: Payer: Self-pay | Admitting: Rheumatology

## 2016-10-06 ENCOUNTER — Ambulatory Visit (INDEPENDENT_AMBULATORY_CARE_PROVIDER_SITE_OTHER): Payer: BLUE CROSS/BLUE SHIELD | Admitting: Rheumatology

## 2016-10-06 VITALS — BP 90/60 | HR 89 | Resp 14 | Ht 63.0 in | Wt 108.0 lb

## 2016-10-06 DIAGNOSIS — M5136 Other intervertebral disc degeneration, lumbar region: Secondary | ICD-10-CM | POA: Diagnosis not present

## 2016-10-06 DIAGNOSIS — M5030 Other cervical disc degeneration, unspecified cervical region: Secondary | ICD-10-CM | POA: Diagnosis not present

## 2016-10-06 DIAGNOSIS — M797 Fibromyalgia: Secondary | ICD-10-CM

## 2016-10-06 DIAGNOSIS — M19041 Primary osteoarthritis, right hand: Secondary | ICD-10-CM

## 2016-10-06 DIAGNOSIS — R5383 Other fatigue: Secondary | ICD-10-CM

## 2016-10-06 DIAGNOSIS — Z8669 Personal history of other diseases of the nervous system and sense organs: Secondary | ICD-10-CM

## 2016-10-06 DIAGNOSIS — M51369 Other intervertebral disc degeneration, lumbar region without mention of lumbar back pain or lower extremity pain: Secondary | ICD-10-CM

## 2016-10-06 DIAGNOSIS — M19042 Primary osteoarthritis, left hand: Secondary | ICD-10-CM | POA: Diagnosis not present

## 2016-10-06 DIAGNOSIS — F5101 Primary insomnia: Secondary | ICD-10-CM | POA: Diagnosis not present

## 2016-10-06 MED ORDER — METHOCARBAMOL 500 MG PO TABS: 500.0000 mg | ORAL_TABLET | Freq: Two times a day (BID) | ORAL | 2 refills | Status: DC | PRN

## 2016-10-06 NOTE — Progress Notes (Signed)
Had previous discussion with patient regarding methocarbamol and tizanidine.  I reviewed the purpose, proper use, and adverse effects of methocarbamol and tizanidine.  She was advised to reduce methocarbamol to twice daily (7 AM and 2 PM) and take tizanidine at bedtime as needed.  Noted most recent methocarbamol prescription was sent in for three times daily.  Discussed with Dr. Estanislado Pandy who wanted to cancel current prescription.  I spoke to Sarah at [facility] and prescription was cancelled.   I also reviewed control substance database.  Patient has not filled opioids from any other provider.  Report was sent for scanning.  Narcotic agreement was updated.    Elisabeth Most, Pharm.D., BCPS, CPP Clinical Pharmacist Pager: [phone] Phone: [phone] 10/06/2016 11:20 AM

## 2016-10-28 ENCOUNTER — Telehealth: Payer: Self-pay | Admitting: Radiology

## 2016-10-28 NOTE — Telephone Encounter (Signed)
Fax received from pharmacy about Methocarbamol back order, will call patient and ask if she has been able to get this, This fax is over a month old, was found in a stack of paperwork on nurses desk.

## 2016-10-28 NOTE — Telephone Encounter (Signed)
My chart message sent to patient to make sure she was able to get Methocarbamol

## 2016-11-11 DIAGNOSIS — R002 Palpitations: Secondary | ICD-10-CM | POA: Diagnosis not present

## 2016-11-30 ENCOUNTER — Other Ambulatory Visit: Payer: Self-pay | Admitting: Rheumatology

## 2016-12-01 NOTE — Telephone Encounter (Signed)
Last Visit: 10/06/16 Next Visit: 04/08/17  Okay to refill per Dr. Estanislado Pandy

## 2016-12-03 DIAGNOSIS — R002 Palpitations: Secondary | ICD-10-CM | POA: Diagnosis not present

## 2016-12-03 DIAGNOSIS — R079 Chest pain, unspecified: Secondary | ICD-10-CM | POA: Diagnosis not present

## 2016-12-09 DIAGNOSIS — R002 Palpitations: Secondary | ICD-10-CM | POA: Diagnosis not present

## 2016-12-11 DIAGNOSIS — R0789 Other chest pain: Secondary | ICD-10-CM | POA: Diagnosis not present

## 2016-12-11 DIAGNOSIS — R002 Palpitations: Secondary | ICD-10-CM | POA: Diagnosis not present

## 2016-12-17 DIAGNOSIS — R002 Palpitations: Secondary | ICD-10-CM | POA: Diagnosis not present

## 2016-12-17 DIAGNOSIS — R079 Chest pain, unspecified: Secondary | ICD-10-CM | POA: Diagnosis not present

## 2017-01-11 ENCOUNTER — Other Ambulatory Visit: Payer: Self-pay | Admitting: Rheumatology

## 2017-01-11 NOTE — Telephone Encounter (Signed)
Last Visit: 10/06/16 Next Visit: 04/08/17 UDS: 04/08/16 Narc Agreement: 09/30/15  Okay to refill Tramadol?

## 2017-01-11 NOTE — Telephone Encounter (Signed)
ok 

## 2017-02-22 ENCOUNTER — Other Ambulatory Visit: Payer: Self-pay | Admitting: Rheumatology

## 2017-02-24 NOTE — Telephone Encounter (Signed)
Last Visit: 10/06/16 Next Visit: 04/08/17  Okay to refill per Dr. Estanislado Pandy

## 2017-03-16 DIAGNOSIS — K224 Dyskinesia of esophagus: Secondary | ICD-10-CM

## 2017-03-16 HISTORY — DX: Dyskinesia of esophagus: K22.4

## 2017-03-16 HISTORY — PX: OTHER SURGICAL HISTORY: SHX169

## 2017-03-23 ENCOUNTER — Other Ambulatory Visit: Payer: Self-pay | Admitting: Rheumatology

## 2017-03-23 NOTE — Telephone Encounter (Signed)
Last Visit: 10/06/16 Next Visit: 04/08/17 UDS: 04/08/16 Narc Agreement: 04/13/16  Left message for patient to call the office.   Okay to refill Tramadol?

## 2017-03-23 NOTE — Telephone Encounter (Signed)
ok 

## 2017-03-25 NOTE — Progress Notes (Signed)
Office Visit Note  Patient: [name]             Date of Birth: April 27, 1965           MRN: [id number]             PCP: April Ruff, MD Referring: April Ruff, MD Visit Date: 04/08/2017 Occupation: @GUAROCC @    Subjective:  Generalized pain    History of Present Illness: [name] is a 52 y.o. female with a history of fibromyalgia, DDD, and osteoarthritis.  Patient states she has been having a fibromyalgia flare for the past 2 weeks.  She states she overdid it by cleaning her house.  She states the left side of her back had severe muscle spasms.  She has been taking Tramadol and Ibuprofen for pain relief.  She is also using a heating pad.  She takes Robaxin and Zanaflex as needed.  She rates her pain today a 5 out of 10.  She states she takes 2 tablets of tramadol in the morning, two tablets at lunch and 1 tablet in the evening.  She states she is having increased pain her trapezius region.  She would like cortisone injections today.  She states she has hand pain and joint swelling.     Activities of Daily Living:  Patient reports morning stiffness for 2-3 hours.   Patient Reports nocturnal pain.  Difficulty dressing/grooming: Denies Difficulty climbing stairs: Reports Difficulty getting out of chair: Reports Difficulty using hands for taps, buttons, cutlery, and/or writing: Reports   Review of Systems  Constitutional: Positive for fatigue. Negative for weakness.  HENT: Positive for mouth dryness. Negative for mouth sores and nose dryness.   Eyes: Positive for dryness. Negative for redness and visual disturbance.  Respiratory: Negative for cough, hemoptysis and shortness of breath.   Cardiovascular: Negative for chest pain, palpitations, hypertension, irregular heartbeat and swelling in legs/feet.  Gastrointestinal: Positive for constipation and heartburn. Negative for blood in stool and diarrhea.  Endocrine: Negative for increased urination.  Genitourinary:  Negative for painful urination.  Musculoskeletal: Positive for arthralgias, joint pain, joint swelling, morning stiffness and muscle tenderness. Negative for myalgias, muscle weakness and myalgias.  Skin: Negative for color change, pallor, rash, hair loss, nodules/bumps, redness, skin tightness, ulcers and sensitivity to sunlight.  Neurological: Positive for headaches (Migraines). Negative for dizziness and numbness.  Hematological: Negative for swollen glands.  Psychiatric/Behavioral: Negative for depressed mood and sleep disturbance. The patient is not nervous/anxious.     PMFS History:  Patient Active Problem List   Diagnosis Date Noted  . Other fatigue 10/01/2016  . Primary insomnia 10/01/2016  . History of migraine 10/01/2016  . DDD (degenerative disc disease), lumbar 04/08/2016  . DDD (degenerative disc disease), cervical 04/08/2016  . Osteoarthritis, hand 04/08/2016  . Fibromyalgia   . Intervertebral disk disease   . HA (headache)     Past Medical History:  Diagnosis Date  . Fibromyalgia   . Intervertebral disk disease   . Migraine     Family History  Problem Relation Age of Onset  . Breast cancer Neg Hx    Past Surgical History:  Procedure Laterality Date  . APPENDECTOMY    . CERVICAL FUSION    . CESAREAN SECTION    . HEMORRHOID SURGERY     Social History   Social History Narrative   Patient lives at home with her husband and son. She has her BS and she is not working.  Objective: Vital Signs: BP 115/75 (BP Location: Left Arm, Patient Position: Sitting, Cuff Size: Normal)   Pulse 94   Resp 14   Ht 5' 2.75" (1.594 m)   Wt 112 lb (50.8 kg)   BMI 20.00 kg/m    Physical Exam  Constitutional: She is oriented to person, place, and time. She appears well-developed and well-nourished.  HENT:  Head: Normocephalic and atraumatic.  Eyes: Conjunctivae and EOM are normal.  Neck: Normal range of motion.  Cardiovascular: Normal rate, regular rhythm, normal  heart sounds and intact distal pulses.  Pulmonary/Chest: Effort normal and breath sounds normal.  Abdominal: Soft. Bowel sounds are normal.  Lymphadenopathy:    She has no cervical adenopathy.  Neurological: She is alert and oriented to person, place, and time.  Skin: Skin is warm and dry. Capillary refill takes less than 2 seconds.  Psychiatric: She has a normal mood and affect. Her behavior is normal.  Nursing note and vitals reviewed.    Musculoskeletal Exam: She has generalized hyperalgesia on exam. C-spine limited ROM with lateral rotation.  Thoracic and lumbar good ROM.  She has midline spinal tenderness and SI joint tenderness. Shoulder joints, elbow joints, wrist joints, MCPs, PIPs, and DIPs good ROM with no synovitis.  Hip joints, knee joints, ankle joints, MTPs, PIPs, and DIPs good ROM with no synovitis. She has right knee crepitus.  No warmth or effusion.  She has bilateral trochanteric bursitis.    CDAI Exam: No CDAI exam completed.    Investigation: No additional findings.   Imaging: No results found.  Speciality Comments: No specialty comments available.    Procedures:  Trigger Point Inj Date/Time: 04/08/2017 11:38 AM Performed by: Bo Merino, MD Authorized by: Bo Merino, MD   Consent Given by:  Patient Indications:  Therapeutic Total # of Trigger Points:  2 Location: neck   Needle Size:  27 G Approach:  Dorsal (Bilateral trapezius trigger point injections) Medications #1:  0.5 mL lidocaine 1 %; 10 mg triamcinolone acetonide 40 MG/ML Medications #2:  0.5 mL lidocaine 1 %; 10 mg triamcinolone acetonide 40 MG/ML Patient tolerance:  Patient tolerated the procedure well with no immediate complications   Allergies: Lyrica [pregabalin]   Assessment / Plan:     Visit Diagnoses: Fibromyalgia: She has been having a flare for the past 2 weeks after overexerting her self cleaning her house.  She has been having increased generalized muscle tenderness  and muscle tension.  She is having significant muscle tension in the trapezius region.  She requested trapezius cortisone trigger point injection.  She will continue on Tramadol PRN, Robaxin and Zanaflex PRN.    Primary osteoarthritis of both hands: She has PIP and DIP synovial thickening on exam consistent with osteoarthritis.  She has no synovitis or tenderness.  Joint protection and muscle strengthening were discussed.      Pain management - Tramadol-UDS and narcotic agreement were updated today.   - Plan: Pain Mgmt, Profile 5 w/Conf, U  Trapezius muscle spasm - She has muscle tenderness and muscle tension in the trapezius region.  She requested bilateral cortisone trigger point injection.  She tolerated the procedure well.  Plan: Trigger Point Inj   DDD (degenerative disc disease), cervical: Chronic pain.  Decreased lateral rotation.    DDD (degenerative disc disease), lumbar: Chronic pain.    Other fatigue: Chronic and related to insomnia.    Primary insomnia: Good sleep hygiene was discussed.   History of migraine - Controlled on Topamax. She wants to continue  with Topamax 50 mg at bedtime     Orders: Orders Placed This Encounter  Procedures  . Trigger Point Inj  . Pain Mgmt, Profile 5 w/Conf, U   No orders of the defined types were placed in this encounter.   Face-to-face time spent with patient was 30  minutes. Greater than 50% of time was spent in counseling and coordination of care.  Follow-Up Instructions: Return in about 6 months (around 10/06/2017) for Fibromyalgia, Osteoarthritis.   Bo Merino, MD  Note - This record has been created using Editor, commissioning.  Chart creation errors have been sought, but may not always  have been located. Such creation errors do not reflect on  the standard of medical care.

## 2017-04-07 ENCOUNTER — Other Ambulatory Visit: Payer: Self-pay | Admitting: Rheumatology

## 2017-04-07 NOTE — Telephone Encounter (Signed)
Last Visit: 10/06/16 Next Visit: 04/08/17  Okay to refill per Dr. Estanislado Pandy

## 2017-04-08 ENCOUNTER — Ambulatory Visit: Payer: BLUE CROSS/BLUE SHIELD | Admitting: Rheumatology

## 2017-04-08 ENCOUNTER — Encounter: Payer: Self-pay | Admitting: Rheumatology

## 2017-04-08 VITALS — BP 115/75 | HR 94 | Resp 14 | Ht 62.75 in | Wt 112.0 lb

## 2017-04-08 DIAGNOSIS — M5136 Other intervertebral disc degeneration, lumbar region: Secondary | ICD-10-CM | POA: Diagnosis not present

## 2017-04-08 DIAGNOSIS — M5030 Other cervical disc degeneration, unspecified cervical region: Secondary | ICD-10-CM

## 2017-04-08 DIAGNOSIS — Z8669 Personal history of other diseases of the nervous system and sense organs: Secondary | ICD-10-CM | POA: Diagnosis not present

## 2017-04-08 DIAGNOSIS — F5101 Primary insomnia: Secondary | ICD-10-CM | POA: Diagnosis not present

## 2017-04-08 DIAGNOSIS — M19042 Primary osteoarthritis, left hand: Secondary | ICD-10-CM | POA: Diagnosis not present

## 2017-04-08 DIAGNOSIS — M797 Fibromyalgia: Secondary | ICD-10-CM | POA: Diagnosis not present

## 2017-04-08 DIAGNOSIS — M51369 Other intervertebral disc degeneration, lumbar region without mention of lumbar back pain or lower extremity pain: Secondary | ICD-10-CM

## 2017-04-08 DIAGNOSIS — M19041 Primary osteoarthritis, right hand: Secondary | ICD-10-CM | POA: Diagnosis not present

## 2017-04-08 DIAGNOSIS — M62838 Other muscle spasm: Secondary | ICD-10-CM | POA: Diagnosis not present

## 2017-04-08 DIAGNOSIS — R5383 Other fatigue: Secondary | ICD-10-CM | POA: Diagnosis not present

## 2017-04-08 DIAGNOSIS — R52 Pain, unspecified: Secondary | ICD-10-CM | POA: Diagnosis not present

## 2017-04-08 MED ORDER — TRIAMCINOLONE ACETONIDE 40 MG/ML IJ SUSP
10.0000 mg | INTRAMUSCULAR | Status: AC | PRN
  Administered 2017-04-08: 10 mg via INTRAMUSCULAR

## 2017-04-08 MED ORDER — LIDOCAINE HCL 1 % IJ SOLN
0.5000 mL | INTRAMUSCULAR | Status: AC | PRN
  Administered 2017-04-08: .5 mL

## 2017-04-08 MED ORDER — TRIAMCINOLONE ACETONIDE 40 MG/ML IJ SUSP
10.0000 mg | INTRAMUSCULAR | Status: AC | PRN
Start: 2017-04-08 — End: 2017-04-08
  Administered 2017-04-08: 10 mg via INTRAMUSCULAR

## 2017-04-09 LAB — PAIN MGMT, PROFILE 5 W/CONF, U
Amphetamines: NEGATIVE ng/mL (ref <500)
BENZODIAZEPINES: NEGATIVE ng/mL (ref <100)
Barbiturates: NEGATIVE ng/mL (ref <300)
Cocaine Metabolite: NEGATIVE ng/mL (ref <150)
Creatinine: 27.1 mg/dL
Marijuana Metabolite: NEGATIVE ng/mL (ref <20)
Methadone Metabolite: NEGATIVE ng/mL (ref <100)
OPIATES: NEGATIVE ng/mL (ref <100)
OXYCODONE: NEGATIVE ng/mL (ref <100)
Oxidant: NEGATIVE ug/mL (ref <200)
pH: 6.79 (ref 4.5–9.0)

## 2017-04-12 NOTE — Progress Notes (Signed)
C/w

## 2017-04-16 ENCOUNTER — Other Ambulatory Visit: Payer: Self-pay | Admitting: Rheumatology

## 2017-04-19 NOTE — Telephone Encounter (Signed)
Ok to refill 

## 2017-04-19 NOTE — Telephone Encounter (Signed)
Last visit: 04/08/2017 Next visit: 10/08/2017  Ok to refill Robaxin?

## 2017-05-26 ENCOUNTER — Other Ambulatory Visit: Payer: Self-pay | Admitting: Rheumatology

## 2017-05-26 NOTE — Telephone Encounter (Signed)
Last visit: 04/08/2017  Next visit: 10/08/2017  Okay to refill per Dr. Deveshwar 

## 2017-05-27 ENCOUNTER — Other Ambulatory Visit: Payer: Self-pay | Admitting: *Deleted

## 2017-05-27 ENCOUNTER — Other Ambulatory Visit: Payer: Self-pay | Admitting: Rheumatology

## 2017-05-27 DIAGNOSIS — Z5181 Encounter for therapeutic drug level monitoring: Secondary | ICD-10-CM

## 2017-05-28 ENCOUNTER — Other Ambulatory Visit: Payer: Self-pay

## 2017-05-28 DIAGNOSIS — Z5181 Encounter for therapeutic drug level monitoring: Secondary | ICD-10-CM

## 2017-05-28 NOTE — Telephone Encounter (Signed)
Last visit: 04/08/2017 Next visit: 10/08/2017 UDS: 04/08/17 and Tramadol portion updated today Narc Agreement: 04/06/17   Okay to refill Tramadol?

## 2017-05-31 DIAGNOSIS — H524 Presbyopia: Secondary | ICD-10-CM | POA: Diagnosis not present

## 2017-05-31 LAB — PAIN MGMT, TRAMADOL W/MEDMATCH, U
DESMETHYLTRAMADOL: 681 ng/mL — AB (ref <100)
Tramadol: 4503 ng/mL — ABNORMAL HIGH (ref <100)

## 2017-05-31 NOTE — Progress Notes (Signed)
C/w

## 2017-06-15 DIAGNOSIS — D225 Melanocytic nevi of trunk: Secondary | ICD-10-CM | POA: Diagnosis not present

## 2017-06-15 DIAGNOSIS — L814 Other melanin hyperpigmentation: Secondary | ICD-10-CM | POA: Diagnosis not present

## 2017-06-15 DIAGNOSIS — L821 Other seborrheic keratosis: Secondary | ICD-10-CM | POA: Diagnosis not present

## 2017-06-15 DIAGNOSIS — L820 Inflamed seborrheic keratosis: Secondary | ICD-10-CM | POA: Diagnosis not present

## 2017-06-15 DIAGNOSIS — D1801 Hemangioma of skin and subcutaneous tissue: Secondary | ICD-10-CM | POA: Diagnosis not present

## 2017-07-07 DIAGNOSIS — Z01419 Encounter for gynecological examination (general) (routine) without abnormal findings: Secondary | ICD-10-CM | POA: Diagnosis not present

## 2017-07-07 DIAGNOSIS — Z6820 Body mass index (BMI) 20.0-20.9, adult: Secondary | ICD-10-CM | POA: Diagnosis not present

## 2017-07-07 DIAGNOSIS — N760 Acute vaginitis: Secondary | ICD-10-CM | POA: Diagnosis not present

## 2017-07-07 DIAGNOSIS — Z780 Asymptomatic menopausal state: Secondary | ICD-10-CM | POA: Diagnosis not present

## 2017-07-12 ENCOUNTER — Other Ambulatory Visit: Payer: Self-pay | Admitting: Physician Assistant

## 2017-07-12 NOTE — Telephone Encounter (Signed)
Last visit: 04/08/2017  Next visit: 10/08/2017  Okay to refill per Dr. Deveshwar 

## 2017-07-14 DIAGNOSIS — N939 Abnormal uterine and vaginal bleeding, unspecified: Secondary | ICD-10-CM | POA: Diagnosis not present

## 2017-07-14 DIAGNOSIS — N924 Excessive bleeding in the premenopausal period: Secondary | ICD-10-CM | POA: Diagnosis not present

## 2017-07-14 DIAGNOSIS — N926 Irregular menstruation, unspecified: Secondary | ICD-10-CM | POA: Diagnosis not present

## 2017-08-03 ENCOUNTER — Other Ambulatory Visit: Payer: Self-pay | Admitting: Rheumatology

## 2017-08-03 NOTE — Telephone Encounter (Signed)
Last visit: 04/08/2017  Next visit: 10/08/2017 UDS: 05/28/2017 c/w Narc agreement: 04/06/2017  Okay to refill tramadol?   

## 2017-08-19 DIAGNOSIS — N926 Irregular menstruation, unspecified: Secondary | ICD-10-CM | POA: Diagnosis not present

## 2017-08-19 DIAGNOSIS — N840 Polyp of corpus uteri: Secondary | ICD-10-CM | POA: Diagnosis not present

## 2017-08-19 DIAGNOSIS — N939 Abnormal uterine and vaginal bleeding, unspecified: Secondary | ICD-10-CM | POA: Diagnosis not present

## 2017-08-24 ENCOUNTER — Other Ambulatory Visit: Payer: Self-pay | Admitting: Rheumatology

## 2017-08-24 NOTE — Telephone Encounter (Signed)
Last visit: 04/08/2017  Next visit: 10/08/2017  Okay to refill per Dr. Deveshwar 

## 2017-09-13 ENCOUNTER — Other Ambulatory Visit: Payer: Self-pay | Admitting: Physician Assistant

## 2017-09-13 NOTE — Telephone Encounter (Signed)
Last visit: 04/08/2017  Next visit: 10/08/2017 UDS: 05/28/2017 c/w Narc agreement: 04/06/2017  Okay to refill tramadol?

## 2017-09-24 NOTE — Progress Notes (Unsigned)
   Office Visit Note  Patient: [name]             Date of Birth: 1965/11/09           MRN: [id number]             PCP: Leighton Ruff, MD Referring: Leighton Ruff, MD Visit Date: 10/08/2017 Occupation: @GUAROCC @  Subjective:  No chief complaint on file.   History of Present Illness: [name] is a 52 y.o. female ***   Activities of Daily Living:  Patient reports morning stiffness for *** {minute/hour:19697}.   Patient {ACTIONS;DENIES/REPORTS:21021675::"Denies"} nocturnal pain.  Difficulty dressing/grooming: {ACTIONS;DENIES/REPORTS:21021675::"Denies"} Difficulty climbing stairs: {ACTIONS;DENIES/REPORTS:21021675::"Denies"} Difficulty getting out of chair: {ACTIONS;DENIES/REPORTS:21021675::"Denies"} Difficulty using hands for taps, buttons, cutlery, and/or writing: {ACTIONS;DENIES/REPORTS:21021675::"Denies"}  No Rheumatology ROS completed.   PMFS History:  Patient Active Problem List   Diagnosis Date Noted  . Other fatigue 10/01/2016  . Primary insomnia 10/01/2016  . History of migraine 10/01/2016  . DDD (degenerative disc disease), lumbar 04/08/2016  . DDD (degenerative disc disease), cervical 04/08/2016  . Osteoarthritis, hand 04/08/2016  . Fibromyalgia   . Intervertebral disk disease   . HA (headache)     Past Medical History:  Diagnosis Date  . Fibromyalgia   . Intervertebral disk disease   . Migraine     Family History  Problem Relation Age of Onset  . Breast cancer Neg Hx    Past Surgical History:  Procedure Laterality Date  . APPENDECTOMY    . CERVICAL FUSION    . CESAREAN SECTION    . HEMORRHOID SURGERY     Social History   Social History Narrative   Patient lives at home with her husband and son. She has her BS and she is not working.     Objective: Vital Signs: There were no vitals taken for this visit.   Physical Exam   Musculoskeletal Exam: ***  CDAI Exam: No CDAI exam completed.   Investigation: No additional  findings.  Imaging: No results found.  Recent Labs: No results found for: WBC, HGB, PLT, NA, K, CL, CO2, GLUCOSE, BUN, CREATININE, BILITOT, ALKPHOS, AST, ALT, PROT, ALBUMIN, CALCIUM, GFRAA  Speciality Comments: No specialty comments available.  Procedures:  No procedures performed Allergies: Lyrica [pregabalin]   Assessment / Plan:     Visit Diagnoses: No diagnosis found.   Orders: No orders of the defined types were placed in this encounter.  No orders of the defined types were placed in this encounter.   Face-to-face time spent with patient was *** minutes. Greater than 50% of time was spent in counseling and coordination of care.  Follow-Up Instructions: No follow-ups on file.   Earnestine Mealing, CMA  Note - This record has been created using Editor, commissioning.  Chart creation errors have been sought, but may not always  have been located. Such creation errors do not reflect on  the standard of medical care.

## 2017-10-08 ENCOUNTER — Other Ambulatory Visit: Payer: Self-pay | Admitting: Rheumatology

## 2017-10-08 ENCOUNTER — Ambulatory Visit: Payer: BLUE CROSS/BLUE SHIELD | Admitting: Rheumatology

## 2017-10-08 NOTE — Progress Notes (Signed)
Office Visit Note  Patient: [name]             Date of Birth: January 09, 1966           MRN: [id number]             PCP: Leighton Ruff, MD Referring: Leighton Ruff, MD Visit Date: 10/14/2017 Occupation: @GUAROCC @  Subjective:  Generalized pain   History of Present Illness: [name] is a 52 y.o. female with history of fibromyalgia, DDD and osteoarthritis. She continues to take Robaxin 500 mg BID PRN and Zanaflex 4 mg at bedtime for muscle spasms.  She reports she continues to take Tramadol 50 mg 1-2 tablets TID for pain relief.  She takes Topamax for migraines and has been having to take tylenol more frequently for tension headaches.  She reports she has been having increased generalized muscle aches and muscle tenderness.  She has trapezius muscle tension.  She has a lot of neck stiffness.  She states that her husband performs myofascial releases nightly which has been helping with her pain some.  She states that she wakes up in the morning very stiff.  She has been having episodes of significant muscle spasms.  She has been having more muscle aches since she has been more active.  She states that ideally she would like to get off tramadol due to it causing confusion.  She is unable to do water aerobics due to being intolerant to cold water.   She states she has noticed a rash on the anterior surface of her torso that started 3 weeks ago.  She denies any new soaps or detergents.  She denies anyone else in the household having a similar rash.  She denies the rash on any sun exposed areas.  She states a few of the lesions become scaly over time. She has tried hydrocortisone cream, which did not help.  She denies any new medications.    Activities of Daily Living:  Patient reports morning stiffness for 1-2 hours.   Patient Reports nocturnal pain.  Difficulty dressing/grooming: Denies Difficulty climbing stairs: Reports Difficulty getting out of chair: Reports Difficulty using hands for  taps, buttons, cutlery, and/or writing: Reports  Review of Systems  Constitutional: Positive for fatigue.  HENT: Positive for mouth dryness. Negative for mouth sores and nose dryness.   Eyes: Positive for dryness. Negative for pain and visual disturbance.  Respiratory: Negative for cough, hemoptysis, shortness of breath and difficulty breathing.   Cardiovascular: Positive for swelling in legs/feet. Negative for chest pain, palpitations and hypertension.  Gastrointestinal: Positive for constipation. Negative for blood in stool and diarrhea.  Endocrine: Negative for increased urination.  Genitourinary: Negative for painful urination.  Musculoskeletal: Positive for arthralgias, joint pain, joint swelling, myalgias, muscle weakness, morning stiffness, muscle tenderness and myalgias.  Skin: Negative for color change, pallor, rash, hair loss, nodules/bumps, skin tightness, ulcers and sensitivity to sunlight.  Allergic/Immunologic: Negative for susceptible to infections.  Neurological: Negative for numbness.  Hematological: Negative for swollen glands.  Psychiatric/Behavioral: Positive for sleep disturbance. Negative for depressed mood. The patient is not nervous/anxious.     PMFS History:  Patient Active Problem List   Diagnosis Date Noted  . Other fatigue 10/01/2016  . Primary insomnia 10/01/2016  . History of migraine 10/01/2016  . DDD (degenerative disc disease), lumbar 04/08/2016  . DDD (degenerative disc disease), cervical 04/08/2016  . Osteoarthritis, hand 04/08/2016  . Fibromyalgia   . Intervertebral disk disease   . HA (headache)  Past Medical History:  Diagnosis Date  . Esophageal spasm 2019  . Fibromyalgia   . Intervertebral disk disease   . Migraine     Family History  Problem Relation Age of Onset  . Breast cancer Neg Hx    Past Surgical History:  Procedure Laterality Date  . APPENDECTOMY    . CERVICAL FUSION    . CESAREAN SECTION    . HEMORRHOID SURGERY    .  UTERINE POLYPS  2019   Social History   Social History Narrative   Patient lives at home with her husband and son. She has her BS and she is not working.     Objective: Vital Signs: BP 104/73 (BP Location: Left Arm, Patient Position: Sitting, Cuff Size: Normal)   Pulse 96   Ht 5\' 3"  (1.6 m)   Wt 115 lb (52.2 kg)   BMI 20.37 kg/m    Physical Exam  Constitutional: She is oriented to person, place, and time. She appears well-developed and well-nourished.  HENT:  Head: Normocephalic and atraumatic.  Eyes: Conjunctivae and EOM are normal.  Neck: Normal range of motion.  Cardiovascular: Normal rate, regular rhythm, normal heart sounds and intact distal pulses.  Pulmonary/Chest: Effort normal and breath sounds normal.  Abdominal: Soft. Bowel sounds are normal.  Lymphadenopathy:    She has no cervical adenopathy.  Neurological: She is alert and oriented to person, place, and time.  Skin: Skin is warm and dry. Capillary refill takes less than 2 seconds.  Psychiatric: She has a normal mood and affect. Her behavior is normal.  Nursing note and vitals reviewed.    Musculoskeletal Exam: C-spine limited range of motion.  Thoracic and lumbar spine good range of motion.  She has generalized hyperalgesia on exam today.  Shoulder joints, elbow joints, wrist joints, MCPs, PIPs, DIPs good range of motion with no synovitis.  Hip joints, knee joints, ankle joints, MTPs, PIPs, DIPs good range of motion no synovitis.  She has tenderness of bilateral trochanteric bursa.  No warmth or effusion of bilateral knee joints.   CDAI Exam: No CDAI exam completed.   Investigation: No additional findings.  Imaging: No results found.  Recent Labs: No results found for: WBC, HGB, PLT, NA, K, CL, CO2, GLUCOSE, BUN, CREATININE, BILITOT, ALKPHOS, AST, ALT, PROT, ALBUMIN, CALCIUM, GFRAA, QFTBGOLD, QFTBGOLDPLUS  Speciality Comments: No specialty comments available.  Procedures:  No procedures  performed Allergies: Lyrica [pregabalin]   Assessment / Plan:     Visit Diagnoses: Fibromyalgia: She has generalized hyperalgesia on exam. 18/18 tender points. She has generalized muscle aches and muscle tenderness. Her fibromyalgia has been flaring recently.  She reports that her patient has been performing myofascial release is on a nightly basis which has been helping her symptoms.  She continues to take tramadol 1 to 2 tablets by mouth 3 times daily for pain relief.  She is very apprehensive to lower her dose of tramadol at this time.  We discussed trying gabapentin for pain relief but she is apprehensive about the side effects and does not want to switch at this time.  She continues to take Tylenol as needed for tension headaches and Topamax for migraine headaches.  She takes Robaxin as needed and Zanaflex 4 mg at bedtime as needed for muscle spasms.  She does not need any refills at this time.  She was encouraged to try water aerobics but she is intolerant to cold water and does not want to try it at this time.  We  discussed the importance of light exercise on a regular basis but she reports that exercise makes her pain worse.  She continues to have chronic insomnia and fatigue.  Primary osteoarthritis of both hands: She is complete fist formation bilaterally.  She has no synovitis on exam.  Joint protection and muscle strengthening were discussed.  Pain management - tramadol. UDS: 05/28/2017.  Narc agreement: 04/06/2017.  She was reminded to return in September for UDS and narcotic agreement up-to-date.  DDD (degenerative disc disease), cervical: She has limited range of motion of her C-spine.  She has tenderness in the trapezius muscles bilaterally.  DDD (degenerative disc disease), lumbar: Chronic pain.  Primary insomnia: Chronic.  Good sleep hygiene was discussed.   Other fatigue: Chronic and related to insomnia.   History of migraine - Controlled on Topamax.   Orders: No orders of the  defined types were placed in this encounter.  No orders of the defined types were placed in this encounter.   Face-to-face time spent with patient was 30  minutes. Greater than 50% of time was spent in counseling and coordination of care.  Follow-Up Instructions: Return in about 6 months (around 04/16/2018) for Fibromyalgia, Osteoarthritis, DDD.   Hazel Sams PA-C   I examined and evaluated the patient with Hazel Sams PA.  I detailed discussion with the patient regarding her current situation.  We discussed the option of gabapentin which she declined.  At this point she wants to continue treatment with tramadol.  The plan of care was discussed as noted above.  Bo Merino, MD  Note - This record has been created using Editor, commissioning.  Chart creation errors have been sought, but may not always  have been located. Such creation errors do not reflect on  the standard of medical care.

## 2017-10-08 NOTE — Telephone Encounter (Signed)
Last visit: 04/08/2017  Next visit: 10/08/2017  Okay to refill per Dr. Estanislado Pandy

## 2017-10-14 ENCOUNTER — Ambulatory Visit: Payer: BLUE CROSS/BLUE SHIELD | Admitting: Rheumatology

## 2017-10-14 ENCOUNTER — Other Ambulatory Visit: Payer: Self-pay | Admitting: Rheumatology

## 2017-10-14 ENCOUNTER — Encounter: Payer: Self-pay | Admitting: Rheumatology

## 2017-10-14 VITALS — BP 104/73 | HR 96 | Ht 63.0 in | Wt 115.0 lb

## 2017-10-14 DIAGNOSIS — R52 Pain, unspecified: Secondary | ICD-10-CM

## 2017-10-14 DIAGNOSIS — M19042 Primary osteoarthritis, left hand: Secondary | ICD-10-CM

## 2017-10-14 DIAGNOSIS — M5030 Other cervical disc degeneration, unspecified cervical region: Secondary | ICD-10-CM | POA: Diagnosis not present

## 2017-10-14 DIAGNOSIS — M19041 Primary osteoarthritis, right hand: Secondary | ICD-10-CM | POA: Diagnosis not present

## 2017-10-14 DIAGNOSIS — M797 Fibromyalgia: Secondary | ICD-10-CM | POA: Diagnosis not present

## 2017-10-14 DIAGNOSIS — F5101 Primary insomnia: Secondary | ICD-10-CM

## 2017-10-14 DIAGNOSIS — R5383 Other fatigue: Secondary | ICD-10-CM

## 2017-10-14 DIAGNOSIS — Z8669 Personal history of other diseases of the nervous system and sense organs: Secondary | ICD-10-CM

## 2017-10-14 DIAGNOSIS — M5136 Other intervertebral disc degeneration, lumbar region: Secondary | ICD-10-CM

## 2017-10-14 NOTE — Patient Instructions (Signed)
Please return for urine drug screen and narcotic agreement in September 2019

## 2017-10-15 NOTE — Telephone Encounter (Signed)
Last visit: 10/14/2017 Next visit: 04/19/2018  Okay to refill per Dr. Estanislado Pandy.

## 2017-10-21 ENCOUNTER — Other Ambulatory Visit: Payer: Self-pay | Admitting: Physician Assistant

## 2017-10-21 NOTE — Telephone Encounter (Signed)
Last visit: 04/08/2017  Next visit: 10/08/2017 UDS: 05/28/2017 c/w Narc agreement: 04/06/2017  Last fill:09/13/17  Okay to refill tramadol?

## 2017-11-26 ENCOUNTER — Other Ambulatory Visit: Payer: Self-pay | Admitting: Physician Assistant

## 2017-11-26 NOTE — Telephone Encounter (Signed)
Last visit: 04/08/2017  Next visit: 10/08/2017 UDS: 05/28/2017 c/w Narc agreement: 04/06/2017  Last fill: 10/21/17  Okay to refill tramadol?

## 2017-11-28 ENCOUNTER — Other Ambulatory Visit: Payer: Self-pay | Admitting: Rheumatology

## 2017-11-29 NOTE — Telephone Encounter (Signed)
Last Visit: 10/14/17 Next Visit: 04/19/18  Okay to refill per Dr. Estanislado Pandy

## 2017-12-31 ENCOUNTER — Other Ambulatory Visit: Payer: Self-pay | Admitting: Physician Assistant

## 2017-12-31 DIAGNOSIS — Z5181 Encounter for therapeutic drug level monitoring: Secondary | ICD-10-CM

## 2017-12-31 NOTE — Telephone Encounter (Signed)
Please advise patient to update UDS and narcotic agreement.

## 2017-12-31 NOTE — Telephone Encounter (Signed)
Patient states she is going through a flare. Patient states she will update on 01/03/18. Per Hazel Sams, PA-C okay to call to pharmacy.

## 2017-12-31 NOTE — Telephone Encounter (Signed)
Attempted to contact patient and left message for patient to call the office.  

## 2017-12-31 NOTE — Telephone Encounter (Signed)
Last visit: 10/14/17 Next visit: 04/19/18 UDS: 05/28/2017 c/w Narc agreement: 04/06/2017  Last fill: 11/26/17  Okay to refill tramadol?

## 2018-01-03 ENCOUNTER — Other Ambulatory Visit: Payer: Self-pay

## 2018-01-03 DIAGNOSIS — Z5181 Encounter for therapeutic drug level monitoring: Secondary | ICD-10-CM | POA: Diagnosis not present

## 2018-01-06 LAB — PAIN MGMT, TRAMADOL W/MEDMATCH, U
Desmethyltramadol: 4368 ng/mL — ABNORMAL HIGH (ref <100)
TRAMADOL: 22088 ng/mL — AB (ref <100)

## 2018-01-06 LAB — PAIN MGMT, PROFILE 5 W/CONF, U
Amphetamines: NEGATIVE ng/mL (ref <500)
BARBITURATES: NEGATIVE ng/mL (ref <300)
BENZODIAZEPINES: NEGATIVE ng/mL (ref <100)
Cocaine Metabolite: NEGATIVE ng/mL (ref <150)
Creatinine: 87.1 mg/dL
METHADONE METABOLITE: NEGATIVE ng/mL (ref <100)
Marijuana Metabolite: NEGATIVE ng/mL (ref <20)
OPIATES: NEGATIVE ng/mL (ref <100)
OXYCODONE: NEGATIVE ng/mL (ref <100)
Oxidant: NEGATIVE ug/mL (ref <200)
pH: 6.74 (ref 4.5–9.0)

## 2018-01-10 NOTE — Progress Notes (Signed)
UDS is consistent with treatment.

## 2018-01-25 ENCOUNTER — Other Ambulatory Visit: Payer: Self-pay | Admitting: Rheumatology

## 2018-01-25 NOTE — Telephone Encounter (Signed)
Last visit: 10/14/17 Next visit: 04/19/18  Okay to refill per Dr. Estanislado Pandy

## 2018-02-07 ENCOUNTER — Other Ambulatory Visit: Payer: Self-pay | Admitting: Physician Assistant

## 2018-02-07 NOTE — Telephone Encounter (Signed)
Last Visit: 10/14/2017 Next Visit: 04/19/2018 UDS: 01/03/2018 Narc Agreement: 01/03/2018  Last fill: 12/31/2017  Okay to refill tramadol?

## 2018-02-27 ENCOUNTER — Other Ambulatory Visit: Payer: Self-pay | Admitting: Rheumatology

## 2018-02-28 NOTE — Telephone Encounter (Signed)
Last Visit: 10/14/2017 Next Visit: 04/19/2018  Okay to refill per Dr. Estanislado Pandy

## 2018-03-14 ENCOUNTER — Other Ambulatory Visit: Payer: Self-pay | Admitting: Physician Assistant

## 2018-03-15 NOTE — Telephone Encounter (Signed)
Last Visit: 10/14/2017 Next Visit: 04/19/2018 UDS: 01/03/2018 Narc Agreement: 01/03/2018  Last fill: 02/07/18  Okay to refill tramadol?

## 2018-03-16 HISTORY — PX: NASAL SEPTUM SURGERY: SHX37

## 2018-03-17 ENCOUNTER — Telehealth: Payer: Self-pay | Admitting: Rheumatology

## 2018-03-17 NOTE — Telephone Encounter (Signed)
Patient states insurance will only cover 4 pills of Tramadol per day, not 6. Patient needs extra pills. Patient states pharmacy told her doctor needs to call insurance to verify patient needs 6 pils a day instead of 4. Please call to advise.

## 2018-03-17 NOTE — Telephone Encounter (Signed)
Patient advised that the insurance will not cover the 6 tablets of Tramadol per day. They will only cover 4 tablets per day. Patient states if we drop her medication to 4 tablets per day is would drastically change her life for the worse. Patient states it would decrease her movement. Patient states that it would cause her to be sedentary. Patient advised to contact the insurance company to see what may be able to be done. Patient is also requesting a call back from Dr. Estanislado Pandy

## 2018-03-21 NOTE — Telephone Encounter (Signed)
LMAM for patient to call back.

## 2018-03-23 NOTE — Telephone Encounter (Signed)
LMAM for patient to call back.

## 2018-04-05 NOTE — Progress Notes (Signed)
Office Visit Note  Patient: [name]             Date of Birth: March 07, 1966           MRN: [id number]             PCP: Leighton Ruff, MD Referring: Leighton Ruff, MD Visit Date: 04/19/2018 Occupation: @GUAROCC @  Subjective:  Lower back pain.   History of Present Illness: [name] is a 53 y.o. female with history of fibromyalgia osteoarthritis and degenerative disc disease.  Patient was accompanied by her husband today.  She was tearful during the conversation and described that she is in a lot of discomfort due to lower back pain.  She states the pain in her lower back radiates into her bilateral lower extremities and caused tingling and numbness.  She states she had a cortisone injection to her lower back several years ago which gave her a lot of relief.  The symptoms have recurred now.  She describes the pain to be severe.  She states the tramadol 4 tablets a day is not controlling her symptoms.  She has been taking muscle relaxers and Topamax to relieve the discomfort.  She states her fibromyalgia pain is fairly well controlled.  She describes her lower back pain to be severe.  She states she is unable to do routine activities due to pain level.  Activities of Daily Living:  Patient reports morning stiffness for 1-2 hours.   Patient Reports nocturnal pain.  Difficulty dressing/grooming: Denies Difficulty climbing stairs: Reports Difficulty getting out of chair: Reports Difficulty using hands for taps, buttons, cutlery, and/or writing: Denies  Review of Systems  Constitutional: Negative for fatigue, night sweats, weight gain and weight loss.  HENT: Positive for mouth dryness. Negative for mouth sores, trouble swallowing, trouble swallowing and nose dryness.   Eyes: Positive for dryness. Negative for pain, redness and visual disturbance.  Respiratory: Negative for cough, shortness of breath and difficulty breathing.   Cardiovascular: Negative for chest pain, palpitations,  hypertension, irregular heartbeat and swelling in legs/feet.  Gastrointestinal: Positive for constipation. Negative for blood in stool and diarrhea.  Endocrine: Negative for increased urination.  Genitourinary: Negative for vaginal dryness.  Musculoskeletal: Positive for arthralgias, joint pain, myalgias, morning stiffness and myalgias. Negative for joint swelling, muscle weakness and muscle tenderness.  Skin: Negative for color change, rash, hair loss, skin tightness, ulcers and sensitivity to sunlight.  Allergic/Immunologic: Negative for susceptible to infections.  Neurological: Negative for dizziness, memory loss, night sweats and weakness.  Hematological: Negative for swollen glands.  Psychiatric/Behavioral: Positive for depressed mood and sleep disturbance. The patient is nervous/anxious.     PMFS History:  Patient Active Problem List   Diagnosis Date Noted  . Other fatigue 10/01/2016  . Primary insomnia 10/01/2016  . History of migraine 10/01/2016  . DDD (degenerative disc disease), lumbar 04/08/2016  . DDD (degenerative disc disease), cervical 04/08/2016  . Osteoarthritis, hand 04/08/2016  . Fibromyalgia   . Intervertebral disk disease   . HA (headache)     Past Medical History:  Diagnosis Date  . Esophageal spasm 2019  . Fibromyalgia   . Intervertebral disk disease   . Migraine     Family History  Problem Relation Age of Onset  . Breast cancer Neg Hx    Past Surgical History:  Procedure Laterality Date  . APPENDECTOMY    . CERVICAL FUSION    . CESAREAN SECTION    . HEMORRHOID SURGERY    .  UTERINE POLYPS  2019   Social History   Social History Narrative   Patient lives at home with her husband and son. She has her BS and she is not working.     There is no immunization history on file for this patient.   Objective: Vital Signs: BP 123/82 (BP Location: Left Arm, Patient Position: Sitting, Cuff Size: Normal)   Pulse 98   Resp 12   Ht 5\' 3"  (1.6 m)   Wt  121 lb (54.9 kg)   BMI 21.43 kg/m    Physical Exam Vitals signs and nursing note reviewed.  Constitutional:      Appearance: She is well-developed.  HENT:     Head: Normocephalic and atraumatic.  Eyes:     Conjunctiva/sclera: Conjunctivae normal.  Neck:     Musculoskeletal: Normal range of motion.  Cardiovascular:     Rate and Rhythm: Normal rate and regular rhythm.     Heart sounds: Normal heart sounds.  Pulmonary:     Effort: Pulmonary effort is normal.     Breath sounds: Normal breath sounds.  Abdominal:     General: Bowel sounds are normal.     Palpations: Abdomen is soft.  Lymphadenopathy:     Cervical: No cervical adenopathy.  Skin:    General: Skin is warm and dry.     Capillary Refill: Capillary refill takes less than 2 seconds.  Neurological:     Mental Status: She is alert and oriented to person, place, and time.  Psychiatric:        Behavior: Behavior normal.      Musculoskeletal Exam: C-spine good range of motion.  She has surgery on her C-spine in the past.  She has painful range of motion of her lumbar spine and tenderness over the lower lumbar region.  She also has tenderness over SI joints.  Shoulder joints elbow joints wrist joints were in good range of motion.  She has mild DIP thickening of her hands.  Hip joints knee joints ankles MTPs PIPs been good range of motion with no synovitis.  She had positive tender points and generalized hyperalgesia.  CDAI Exam: CDAI Score: Not documented Patient Global Assessment: Not documented; Provider Global Assessment: Not documented Swollen: Not documented; Tender: Not documented Joint Exam   Not documented   There is currently no information documented on the homunculus. Go to the Rheumatology activity and complete the homunculus joint exam.  Investigation: No additional findings.  Imaging: Xr Lumbar Spine 2-3 Views  Result Date: 04/19/2018 No disc space narrowing was noted.  Facet joint arthropathy was  noted.  No SI joint to sclerosis was noted.  Xr Pelvis 1-2 Views  Result Date: 04/19/2018 No SI joint sclerosis was noted. Impression: Unremarkable x-ray of the SI joints.   Recent Labs: No results found for: WBC, HGB, PLT, NA, K, CL, CO2, GLUCOSE, BUN, CREATININE, BILITOT, ALKPHOS, AST, ALT, PROT, ALBUMIN, CALCIUM, GFRAA, QFTBGOLD, QFTBGOLDPLUS  Speciality Comments: No specialty comments available.  Procedures:  No procedures performed Allergies: Lyrica [pregabalin]   Assessment / Plan:     Visit Diagnoses: Fibromyalgia -patient complains she continues to have some generalized pain from fibromyalgia which is tolerable with current medications.  Although she has been experiencing increased pain since she has decreased her tramadol dose.  Robaxin a.m. , noon and Zanaflex 4 mg at bedtime as needed for muscle spasms.Topamax for migraine headaches.    Pain management -she has been on tapering dose of tramadol which has flared her symptoms.  She states she has been having a lot of pain on 4-1/2 tablets of tramadol.   UDS: 10/21/2019Narc agreement: 01/03/2018.  I offered referral to pain clinic but at this time she is not interested.  Primary osteoarthritis of both hands-she is not having much discomfort in her hands.  DDD (degenerative disc disease), cervical-she had surgery in the past from her cervical spine by Dr. Marcial Pacas.  Chronic midline low back pain with bilateral sciatica -she complains of severe lower back pain and bilateral lower extremity radiculopathy.  Plan: XR Pelvis 1-2 Views, XR Lumbar Spine 2-3 Views.  The x-ray findings are consistent with facet joint arthropathy.  I will schedule MRI of the lumbar spine.  We will call patient with the results.  DDD (degenerative disc disease), lumbar-I will refer her to Dr. Louanne Skye and Dr. Ernestina Patches for evaluation after the MRI of the lumbar spine.  Other fatigue-secondary to insomnia  Primary insomnia-secondary to nocturnal pain.  History of  migraine - Controlled on Topamax.   Orders: Orders Placed This Encounter  Procedures  . XR Pelvis 1-2 Views  . XR Lumbar Spine 2-3 Views  . MR LUMBAR SPINE WO CONTRAST   No orders of the defined types were placed in this encounter.   Face-to-face time spent with patient was 45 minutes. Greater than 50% of time was spent in counseling and coordination of care.  Follow-Up Instructions: Return in about 6 months (around 10/18/2018) for FMS, OA, DDD.   Bo Merino, MD  Note - This record has been created using Editor, commissioning.  Chart creation errors have been sought, but may not always  have been located. Such creation errors do not reflect on  the standard of medical care.

## 2018-04-08 ENCOUNTER — Other Ambulatory Visit: Payer: Self-pay | Admitting: Rheumatology

## 2018-04-08 NOTE — Telephone Encounter (Signed)
Last Visit:10/14/2017 Next Visit:04/19/2018  Okay to refill per Dr. Estanislado Pandy

## 2018-04-19 ENCOUNTER — Ambulatory Visit: Payer: BLUE CROSS/BLUE SHIELD | Admitting: Rheumatology

## 2018-04-19 ENCOUNTER — Ambulatory Visit (INDEPENDENT_AMBULATORY_CARE_PROVIDER_SITE_OTHER): Payer: BLUE CROSS/BLUE SHIELD

## 2018-04-19 ENCOUNTER — Encounter: Payer: Self-pay | Admitting: Rheumatology

## 2018-04-19 VITALS — BP 123/82 | HR 98 | Resp 12 | Ht 63.0 in | Wt 121.0 lb

## 2018-04-19 DIAGNOSIS — M797 Fibromyalgia: Secondary | ICD-10-CM | POA: Diagnosis not present

## 2018-04-19 DIAGNOSIS — M5442 Lumbago with sciatica, left side: Secondary | ICD-10-CM

## 2018-04-19 DIAGNOSIS — M5441 Lumbago with sciatica, right side: Secondary | ICD-10-CM

## 2018-04-19 DIAGNOSIS — M5030 Other cervical disc degeneration, unspecified cervical region: Secondary | ICD-10-CM | POA: Diagnosis not present

## 2018-04-19 DIAGNOSIS — G8929 Other chronic pain: Secondary | ICD-10-CM

## 2018-04-19 DIAGNOSIS — M19041 Primary osteoarthritis, right hand: Secondary | ICD-10-CM

## 2018-04-19 DIAGNOSIS — R52 Pain, unspecified: Secondary | ICD-10-CM | POA: Diagnosis not present

## 2018-04-19 DIAGNOSIS — Z8669 Personal history of other diseases of the nervous system and sense organs: Secondary | ICD-10-CM

## 2018-04-19 DIAGNOSIS — M5136 Other intervertebral disc degeneration, lumbar region: Secondary | ICD-10-CM

## 2018-04-19 DIAGNOSIS — R5383 Other fatigue: Secondary | ICD-10-CM

## 2018-04-19 DIAGNOSIS — F5101 Primary insomnia: Secondary | ICD-10-CM

## 2018-04-19 DIAGNOSIS — M19042 Primary osteoarthritis, left hand: Secondary | ICD-10-CM

## 2018-04-20 ENCOUNTER — Other Ambulatory Visit: Payer: Self-pay | Admitting: Rheumatology

## 2018-04-21 NOTE — Telephone Encounter (Signed)
Last Visit: 04/19/18 Next Visit: 10/18/18  Okay to refill per Dr. Deveshwar  

## 2018-04-29 ENCOUNTER — Ambulatory Visit (HOSPITAL_COMMUNITY)
Admission: RE | Admit: 2018-04-29 | Discharge: 2018-04-29 | Disposition: A | Discharge status: Home / Self Care | Payer: BLUE CROSS/BLUE SHIELD | Source: Ambulatory Visit | Attending: Rheumatology | Admitting: Rheumatology

## 2018-04-29 DIAGNOSIS — G8929 Other chronic pain: Secondary | ICD-10-CM | POA: Insufficient documentation

## 2018-04-29 DIAGNOSIS — M5442 Lumbago with sciatica, left side: Secondary | ICD-10-CM | POA: Diagnosis not present

## 2018-04-29 DIAGNOSIS — M5441 Lumbago with sciatica, right side: Secondary | ICD-10-CM | POA: Insufficient documentation

## 2018-04-29 DIAGNOSIS — M5126 Other intervertebral disc displacement, lumbar region: Secondary | ICD-10-CM | POA: Diagnosis not present

## 2018-04-29 DIAGNOSIS — M47816 Spondylosis without myelopathy or radiculopathy, lumbar region: Secondary | ICD-10-CM | POA: Diagnosis not present

## 2018-05-02 ENCOUNTER — Other Ambulatory Visit: Payer: Self-pay | Admitting: Physician Assistant

## 2018-05-02 NOTE — Progress Notes (Signed)
MRI of the lumbar spine shows mild degenerative changes.  The disc desiccation was noted at L3-4.  There is no change in her MRI from her previous MRI of 2011.  Please refer her to Dr. Louanne Skye

## 2018-05-02 NOTE — Telephone Encounter (Signed)
Last Visit: 04/19/18 Next Visit: 10/18/18 UDS: 01/03/18 Narc Agreement: 01/03/18  Okay to refill Tramadol?

## 2018-05-03 ENCOUNTER — Telehealth: Payer: Self-pay | Admitting: Rheumatology

## 2018-05-03 DIAGNOSIS — M5442 Lumbago with sciatica, left side: Principal | ICD-10-CM

## 2018-05-03 DIAGNOSIS — M5441 Lumbago with sciatica, right side: Principal | ICD-10-CM

## 2018-05-03 DIAGNOSIS — R309 Painful micturition, unspecified: Secondary | ICD-10-CM | POA: Diagnosis not present

## 2018-05-03 DIAGNOSIS — N819 Female genital prolapse, unspecified: Secondary | ICD-10-CM | POA: Diagnosis not present

## 2018-05-03 DIAGNOSIS — N393 Stress incontinence (female) (male): Secondary | ICD-10-CM | POA: Diagnosis not present

## 2018-05-03 DIAGNOSIS — N941 Unspecified dyspareunia: Secondary | ICD-10-CM | POA: Diagnosis not present

## 2018-05-03 DIAGNOSIS — N760 Acute vaginitis: Secondary | ICD-10-CM | POA: Diagnosis not present

## 2018-05-03 DIAGNOSIS — G8929 Other chronic pain: Secondary | ICD-10-CM

## 2018-05-03 NOTE — Telephone Encounter (Signed)
Patient had MRI of L-spine 04/29/2018. Patient was under the impression Dr. Estanislado Pandy wanted her to schedule an appt with Dr. Louanne Skye, and called [facility] to schedule this appt. Per patient, person she spoke with was very upset with her about scheduling without a referral. Patient also needs a referral for Dr. Ernestina Patches, if Dr. Estanislado Pandy is ready for patient to proceed with this. Please call patient to inform. Patient wanted to let you know she will also be having a Hysterectomy. Patient not sure of surgery date as of yet. Patient concerned with moving on to another doctor other than Dr. Estanislado Pandy, and Tramadol RX . Please call patient to discuss.Marland Kitchen

## 2018-05-04 NOTE — Telephone Encounter (Signed)
Patient had an MRI of lumbar spine on 04/29/2018 which revealed "MRI of the lumbar spine shows mild degenerative changes. The disc desiccation was noted at L3-4. There is no change in her MRI from her previous MRI of 2011" Patient is scheduled to see Dr. Louanne Skye on 06/15/2018. Patient is also requesting a referral to Dr. Ernestina Patches for an injection. Okay to place referral?   Patient states she found out yesterday that she has to have a hysterectomy but the date is unknown. She will call back once she receives more information.

## 2018-05-04 NOTE — Telephone Encounter (Signed)
Referral to Dr. Ernestina Patches has been placed.

## 2018-05-04 NOTE — Telephone Encounter (Signed)
Yes, ok to refer.

## 2018-05-17 ENCOUNTER — Ambulatory Visit (INDEPENDENT_AMBULATORY_CARE_PROVIDER_SITE_OTHER): Payer: BLUE CROSS/BLUE SHIELD | Admitting: Physical Medicine and Rehabilitation

## 2018-05-17 ENCOUNTER — Ambulatory Visit (INDEPENDENT_AMBULATORY_CARE_PROVIDER_SITE_OTHER): Payer: Self-pay

## 2018-05-17 ENCOUNTER — Encounter (INDEPENDENT_AMBULATORY_CARE_PROVIDER_SITE_OTHER): Payer: Self-pay | Admitting: Physical Medicine and Rehabilitation

## 2018-05-17 VITALS — BP 127/81 | HR 102 | Ht 63.0 in | Wt 116.5 lb

## 2018-05-17 DIAGNOSIS — M461 Sacroiliitis, not elsewhere classified: Secondary | ICD-10-CM

## 2018-05-17 DIAGNOSIS — M533 Sacrococcygeal disorders, not elsewhere classified: Secondary | ICD-10-CM | POA: Diagnosis not present

## 2018-05-17 DIAGNOSIS — M797 Fibromyalgia: Secondary | ICD-10-CM | POA: Diagnosis not present

## 2018-05-17 DIAGNOSIS — G894 Chronic pain syndrome: Secondary | ICD-10-CM | POA: Diagnosis not present

## 2018-05-17 NOTE — Progress Notes (Signed)
 .  Numeric Pain Rating Scale and Functional Assessment Average Pain 6 Pain Right Now 5 My pain is constant, sharp, dull, stabbing and aching Pain is worse with: walking and standing Pain improves with: medication   In the last MONTH (on 0-10 scale) has pain interfered with the following?  1. General activity like being  able to carry out your everyday physical activities such as walking, climbing stairs, carrying groceries, or moving a chair?  Rating(7)  2. Relation with others like being able to carry out your usual social activities and roles such as  activities at home, at work and in your community. Rating(7)  3. Enjoyment of life such that you have  been bothered by emotional problems such as feeling anxious, depressed or irritable?  Rating(5)

## 2018-05-17 NOTE — Progress Notes (Signed)
AVALEIGH Andrade - 53 y.o. female MRN [id number]  Date of birth: [birth year]  Office Visit Note: Visit Date: 05/17/2018 PCP: Leighton Ruff, MD Referred by: Leighton Ruff, MD  Subjective: Chief Complaint  Patient presents with  . Right Leg - Pain  . Left Leg - Pain  . Right Hip - Pain  . Left Hip - Pain  . Lower Back - Pain   HPI: [name] is a 53 y.o. female who comes in today At the request of Dr. Cy Blamer for evaluation management of chronic buttock pain with referral pain into both legs.  I actually saw the patient approximately 9 years ago and completed sacroiliac joint injection with profound relief a lot of her pain complaints at that time.  She reports an injury where she fell onto her buttock area and she relates all of her pain complaints to that one specific episode.  There were no fractures or other physical injury at the time.  She had MRI at that time that showed very mild degenerative changes L3-4 and no other changes of the hips or sacroiliac joints or pelvis.  She went on to have a multitude of complaints from low back to upper back to arms to muscle spasms and even perianal tingling numbness at times.  Again she relates this all to that 1 event.  Although Dr. Estanislado Pandy diagnosis her with fibromyalgia and talks about that and that both patient does not feel like she has fibromyalgia.  She does state that her pain is worse with standing too long and not so much with sitting.  She does use ibuprofen with some help.  She has had physical therapy and other conservative care.  She takes 4 tramadol a day that does not seem to help much.  She is also been taking muscle relaxers and topiramate.  She reports that her pain is constant sometimes sharp sometimes dull stabbing and aching.  Review of Systems  Constitutional: Negative for chills, fever, malaise/fatigue and weight loss.  HENT: Negative for hearing loss and sinus pain.   Eyes: Negative for blurred vision, double  vision and photophobia.  Respiratory: Negative for cough and shortness of breath.   Cardiovascular: Negative for chest pain, palpitations and leg swelling.  Gastrointestinal: Negative for abdominal pain, nausea and vomiting.  Genitourinary: Negative for flank pain.  Musculoskeletal: Positive for back pain and joint pain. Negative for myalgias.  Skin: Negative for itching and rash.  Neurological: Negative for tremors, focal weakness and weakness.  Endo/Heme/Allergies: Negative.   Psychiatric/Behavioral: Negative for depression.  All other systems reviewed and are negative.  Otherwise per HPI.  Assessment & Plan: Visit Diagnoses:  1. Sacroiliitis ([place])   2. Sacroiliac dysfunction   3. Fibromyalgia   4. Chronic pain syndrome     Plan: Findings:  Chronic worsening midline buttock pain with referral pain down both legs in a nondermatomal fashion.  She did have updated MRI from this year and this is reviewed with her today and reviewed with images and models.  She really has no change in her MRI from 2011.  There is minimal degenerative change at L3-4.  I have tried to show her that there is no focal nerve compression there is no severe arthritis she has a good spine.  We do not have an MRI of the pelvis which might be beneficial to see if there is anything different there that was not caught in the past.  X-ray of the pelvis is pretty unremarkable.  We can go ahead and repeat a right sacroiliac joint injection and see if this gives her some relief once again.  I had a really long talk with her about fibromyalgia today and I do think she has this.  I think she would do well in a pain management program with coping strategies and pain psychology but unfortunately they are hard to come by.  There is a good program run by [employer] but it is hard to get into.  We will try the injection today she will continue to follow with Dr. Estanislado Pandy.  If she wants to in the future be happy to look at MRI of  the pelvis.  Greater than 50% of this visit (total duration of visit was more than 45 minutes) was spent in counseling and coordination of care discussing diagnosis and treatment of fibromyalgia as well as sacroiliac pain and pelvic pain.    Meds & Orders: No orders of the defined types were placed in this encounter.   Orders Placed This Encounter  Procedures  . Sacroiliac Joint Inj  . XR C-ARM NO REPORT    Follow-up: Return if symptoms worsen or fail to improve.   Procedures: Sacroiliac Joint Inj on 05/17/2018 10:06 AM Indications: pain and diagnostic evaluation Details: 22 G 3.5 in needle, fluoroscopy-guided posterior approach Medications (Right): 40 mg methylPREDNISolone acetate 40 MG/ML; 2 mL bupivacaine 0.5 % Medications (Left): 40 mg methylPREDNISolone acetate 40 MG/ML; 2 mL bupivacaine 0.5 % Outcome: tolerated well, no immediate complications  There was excellent flow of contrast producing a partial arthrogram of the sacroiliac joint.  Procedure, treatment alternatives, risks and benefits explained, specific risks discussed. Consent was given by the patient. Immediately prior to procedure a time out was called to verify the correct patient, procedure, equipment, support staff and site/side marked as required. Patient was prepped and draped in the usual sterile fashion.      No notes on file   Clinical History: MRI LUMBAR SPINE WITHOUT CONTRAST  TECHNIQUE: Multiplanar, multisequence MR imaging of the lumbar spine was performed. No intravenous contrast was administered.  COMPARISON:  Radiography 04/19/2018.  MRI 02/03/2010.  FINDINGS: Segmentation:  5 lumbar type vertebral bodies.  Alignment:  Normal  Vertebrae:  Normal  Conus medullaris and cauda equina: Conus extends to the L2 level. Conus and cauda equina appear normal.  Paraspinal and other soft tissues: Negative  Disc levels:  No abnormality at T12-L1, L1-2 or L2-3.  L3-4: Mild desiccation and  bulging of the disc. Mild facet and ligamentous hypertrophy. No compressive stenosis.  L4-5 and L5-S1: Normal.  No disc or facet pathology.  No stenosis.  Compared to the study of 2011, the findings are quite similar. There is not been any significant progression of degenerative change at L3-4.  IMPRESSION: Very similar appearance to the study of 2011. Mild degenerative change at L3-4 with mild desiccation and bulging of the disc. Mild facet hypertrophy. No apparent compressive stenosis. The other levels are normal.   Electronically Signed   By: Nelson Chimes M.D.   On: 04/29/2018 16:33   She reports that she has quit smoking. Her smoking use included e-cigarettes. She quit after 10.00 years of use. She has never used smokeless tobacco. No results for input(s): HGBA1C, LABURIC in the last 8760 hours.  Objective:  VS:  HT:5\' 3"  (160 cm)   WT:116 lb 8 oz (52.8 kg)  BMI:20.64    BP:127/81  HR:(!) 102bpm  TEMP: ( )  RESP:  Physical Exam Vitals  signs and nursing note reviewed.  Constitutional:      General: She is not in acute distress.    Appearance: Normal appearance. She is well-developed.  HENT:     Head: Normocephalic and atraumatic.     Nose: Nose normal.     Mouth/Throat:     Mouth: Mucous membranes are moist.     Pharynx: Oropharynx is clear.  Eyes:     Conjunctiva/sclera: Conjunctivae normal.     Pupils: Pupils are equal, round, and reactive to light.  Neck:     Musculoskeletal: Normal range of motion and neck supple.  Cardiovascular:     Rate and Rhythm: Regular rhythm.  Pulmonary:     Effort: Pulmonary effort is normal. No respiratory distress.  Abdominal:     General: There is no distension.     Palpations: Abdomen is soft.     Tenderness: There is no guarding.  Musculoskeletal:     Right lower leg: No edema.     Left lower leg: No edema.     Comments: Patient ambulates without aid with a normal gait.  She stands and goes into extension without much  difficulty.  She does have tender points and areas of pain to palpation lightly along the musculature.  She has no pain really over the greater trochanters.  She has an equivocal Patrick's test bilaterally it does seem to cause some discomfort.  She has negative clonus bilaterally and good strength.  Skin:    General: Skin is warm and dry.     Findings: No erythema or rash.  Neurological:     General: No focal deficit present.     Mental Status: She is alert and oriented to person, place, and time.     Motor: No abnormal muscle tone.     Coordination: Coordination normal.     Gait: Gait normal.  Psychiatric:        Mood and Affect: Mood normal.        Behavior: Behavior normal.        Thought Content: Thought content normal.     Ortho Exam Imaging: No results found.  Past Medical/Family/Surgical/Social History: Medications & Allergies reviewed per EMR, new medications updated. Patient Active Problem List   Diagnosis Date Noted  . Other fatigue 10/01/2016  . Primary insomnia 10/01/2016  . History of migraine 10/01/2016  . DDD (degenerative disc disease), lumbar 04/08/2016  . DDD (degenerative disc disease), cervical 04/08/2016  . Osteoarthritis, hand 04/08/2016  . Fibromyalgia   . Intervertebral disk disease   . HA (headache)    Past Medical History:  Diagnosis Date  . Esophageal spasm 2019  . Fibromyalgia   . Intervertebral disk disease   . Migraine    Family History  Problem Relation Age of Onset  . Breast cancer Neg Hx    Past Surgical History:  Procedure Laterality Date  . APPENDECTOMY    . CERVICAL FUSION    . CESAREAN SECTION    . HEMORRHOID SURGERY    . UTERINE POLYPS  2019   Social History   Occupational History  . Not on file  Tobacco Use  . Smoking status: Former Smoker    Years: 10.00    Types: E-cigarettes  . Smokeless tobacco: Never Used  Substance and Sexual Activity  . Alcohol use: Not Currently  . Drug use: No  . Sexual activity: Not on  file

## 2018-05-19 ENCOUNTER — Other Ambulatory Visit: Payer: Self-pay | Admitting: Rheumatology

## 2018-05-19 NOTE — Telephone Encounter (Signed)
Last Visit: 04/19/18 Next Visit: 10/18/18  Okay to refill per Dr. Deveshwar  

## 2018-05-31 ENCOUNTER — Other Ambulatory Visit: Payer: Self-pay | Admitting: Physician Assistant

## 2018-05-31 NOTE — H&P (Signed)
[name], [name] MEDICAL RECORD LE:[id number] ACCOUNT [id number] DATE OF BIRTH:1965/03/29 FACILITY: [facility] LOCATION:  PHYSICIAN:Lovene Maret Garry Heater, MD  HISTORY AND PHYSICAL  DATE OF ADMISSION:  06/20/2018  CHIEF COMPLAINT:  Dyspareunia, symptomatic pelvic relaxation.  HISTORY OF PRESENT ILLNESS:  53 year old G1 P1, One prior C-section, patient has had a 55-month history of increased pelvic pressure, especially when she is on her feet.  Rectal pressure, difficulty and discomfort with intercourse.  On exam, was noted to  have moderate uterine prolapse with a small cystocele and rectocele with cuff support looked reasonably normal.  We discussed a number of options including pelvic PT, vaginal estrogen.  She would prefer to proceed with definitive surgery.  We outlined  the details including the specific risks regarding bleeding, infection, transfusion, wound infection, phlebitis possible need to complete the surgery via open technique.  Her postoperative recovery expectations, possible use of vaginal estrogen or ERT  are all reviewed.  PAST MEDICAL HISTORY: ALLERGIES:  None.  She is up to date on her mammography and colonoscopy screening.  She is a G1 P1.  One prior cesarean section.  PAST SURGICAL HISTORY:  Appendectomy and a tubal ligation, D and C.  REVIEW OF SYSTEMS:  Significant for history of migraine headache and arthritis.  MEDICATIONS:  Iron, magnesium supplements, methocarbamol 500 as needed, tizanadine 4 mg topiramate, tramadol as needed.  PHYSICAL EXAMINATION: VITAL SIGNS:  Temperature 98.2, blood pressure 108/80. HEENT:  Unremarkable. NECK:  Supple, without masses. LUNGS:  Clear. CARDIOVASCULAR:  Regular rate and rhythm without murmurs, rubs or gallops. BREASTS:  Without masses. ABDOMEN:  Soft, flat, nontender. PELVIC:  Vulva, vagina, cervix normal.  On straining, had a mild to moderate uterine prolapse, small cystocele and rectocele.  Bimanual otherwise  negative.  PLAN:  Reviewed options as above.  The patient prefers definitive surgery LAVH, A and P repair, possible SSLF, bilateral salpingectomy with conservation of ovaries, in particular, if normal at the time of laparoscopy.  Procedure and risks reviewed as  above.     AN/NUANCE  D:05/30/2018 T:05/30/2018 JOB:[id number]

## 2018-05-31 NOTE — Telephone Encounter (Signed)
Last Visit: 04/19/18 Next Visit: 10/18/18 UDS: 01/03/18 Neg  Narc Agreement: 01/03/18  Okay to refill Tramadol?  

## 2018-05-31 NOTE — H&P (Deleted)
  The note originally documented on this encounter has been moved the the encounter in which it belongs.  

## 2018-06-06 ENCOUNTER — Encounter (INDEPENDENT_AMBULATORY_CARE_PROVIDER_SITE_OTHER): Payer: Self-pay | Admitting: Physical Medicine and Rehabilitation

## 2018-06-06 MED ORDER — METHYLPREDNISOLONE ACETATE 40 MG/ML IJ SUSP
40.0000 mg | INTRAMUSCULAR | Status: AC | PRN
  Administered 2018-05-17: 40 mg via INTRA_ARTICULAR

## 2018-06-06 MED ORDER — BUPIVACAINE HCL 0.5 % IJ SOLN
2.0000 mL | INTRAMUSCULAR | Status: AC | PRN
  Administered 2018-05-17: 2 mL via INTRA_ARTICULAR

## 2018-06-15 ENCOUNTER — Ambulatory Visit (INDEPENDENT_AMBULATORY_CARE_PROVIDER_SITE_OTHER): Payer: BLUE CROSS/BLUE SHIELD | Admitting: Specialist

## 2018-07-01 ENCOUNTER — Other Ambulatory Visit: Payer: Self-pay | Admitting: Physician Assistant

## 2018-07-01 DIAGNOSIS — Z5181 Encounter for therapeutic drug level monitoring: Secondary | ICD-10-CM

## 2018-07-01 DIAGNOSIS — G8929 Other chronic pain: Secondary | ICD-10-CM

## 2018-07-01 NOTE — Telephone Encounter (Signed)
Ok to refill.  She is due to update UDS and narcotic agreement this month. Please place future orders to update UDS and mail narcotic agreement to the patient to complete and return to [place].

## 2018-07-01 NOTE — Telephone Encounter (Signed)
Patient advised she is due to update UDS. Mailed narc agreement to patient.

## 2018-07-01 NOTE — Telephone Encounter (Signed)
Last Visit: 04/19/18 Next Visit: 10/18/18 UDS: 01/03/18 Neg  Narc Agreement: 01/03/18  Okay to refill Tramadol?

## 2018-07-12 DIAGNOSIS — Z5181 Encounter for therapeutic drug level monitoring: Secondary | ICD-10-CM | POA: Diagnosis not present

## 2018-07-12 DIAGNOSIS — G8929 Other chronic pain: Secondary | ICD-10-CM | POA: Diagnosis not present

## 2018-07-14 LAB — PAIN MGMT, TRAMADOL W/MEDMATCH, U
Desmethyltramadol: 998 ng/mL
Tramadol: 7640 ng/mL

## 2018-07-14 LAB — PAIN MGMT, PROFILE 5 W/CONF, U
Amphetamines: NEGATIVE ng/mL
Barbiturates: NEGATIVE ng/mL
Benzodiazepines: NEGATIVE ng/mL
Cocaine Metabolite: NEGATIVE ng/mL
Creatinine: 29.8 mg/dL
Marijuana Metabolite: NEGATIVE ng/mL
Methadone Metabolite: NEGATIVE ng/mL
Opiates: NEGATIVE ng/mL
Oxidant: NEGATIVE ug/mL
Oxycodone: NEGATIVE ng/mL
pH: 7.2 (ref 4.5–9.0)

## 2018-07-14 NOTE — Telephone Encounter (Signed)
C/w

## 2018-07-17 ENCOUNTER — Other Ambulatory Visit: Payer: Self-pay | Admitting: Rheumatology

## 2018-07-18 NOTE — Telephone Encounter (Signed)
Last Visit: 04/19/2018 Next Visit: 10/18/2018  Okay to refill per Dr. Estanislado Pandy.

## 2018-07-21 ENCOUNTER — Ambulatory Visit (HOSPITAL_BASED_OUTPATIENT_CLINIC_OR_DEPARTMENT_OTHER): Admit: 2018-07-21 | Payer: BLUE CROSS/BLUE SHIELD | Admitting: Obstetrics and Gynecology

## 2018-07-21 ENCOUNTER — Encounter (HOSPITAL_BASED_OUTPATIENT_CLINIC_OR_DEPARTMENT_OTHER): Payer: Self-pay

## 2018-07-21 SURGERY — HYSTERECTOMY, VAGINAL, LAPAROSCOPY-ASSISTED, WITH SALPINGECTOMY
Anesthesia: General

## 2018-07-27 DIAGNOSIS — Z01419 Encounter for gynecological examination (general) (routine) without abnormal findings: Secondary | ICD-10-CM | POA: Diagnosis not present

## 2018-07-27 DIAGNOSIS — Z1231 Encounter for screening mammogram for malignant neoplasm of breast: Secondary | ICD-10-CM | POA: Diagnosis not present

## 2018-07-27 DIAGNOSIS — Z6820 Body mass index (BMI) 20.0-20.9, adult: Secondary | ICD-10-CM | POA: Diagnosis not present

## 2018-07-27 NOTE — Patient Instructions (Addendum)
[name]  07/27/2018     Your procedure is scheduled on 08-01-18   YOU NEED TO HAVE A COVID 19 TEST ON  07-28-18 , THIS TEST MUST BE DONE BEFORE SURGERY, COME TO [facility] ENTR[facility] BETWEEN THE HOURS OF 900 AM AND 300 PM ON YOUR COVID TEST DATE.    Report to [place]  at  5:30 A.M.   Call this number if you have problems the morning of [phone]   OUR ADDRESS IS [address], WE ARE LOCATED IN THE [facility] WITH [facility].   Remember:NO SOLID FOOD AFTER MIDNIGHT THE NIGHT PRIOR TO SURGERY. NOTHING BY MOUTH EXCEPT CLEAR LIQUIDS UNTIL 3 HOURS PRIOR TO SCHEDULED SURGERY. PLEASE FINISH ENSURE DRINK PER SURGEON ORDER AT 4:30 AM.    CLEAR LIQUID DIET   Foods Allowed                                                                     Foods Excluded  Coffee and tea, regular and decaf                             liquids that you cannot  Plain Jell-O in any flavor                                             see through such as: Fruit ices (not with fruit pulp)                                     milk, soups, orange juice  Iced Popsicles                                    All solid food Carbonated beverages, regular and diet                                    Cranberry, grape and apple juices Sports drinks like Gatorade Lightly seasoned clear broth or consume(fat free) Sugar, honey syrup  Sample Menu Breakfast                                Lunch                                     Supper Cranberry juice                    Beef broth                            Chicken broth Jell-O  Grape juice                           Apple juice Coffee or tea                        Jell-O                                      Popsicle                                                Coffee or tea                        Coffee or  tea  _____________________________________________________________________    Take these medicines the morning of surgery with A SIP OF WATER: None   Do not wear jewelry, make-up or nail polish.   Do not wear lotions, powders, or perfumes, or deoderant.   Do not shave 48 hours prior to surgery.     Do not bring valuables to the hospital.  [facility] is not responsible for any belongings or valuables.  Contacts, dentures or bridgework may not be worn into surgery.  Leave your suitcase in the car.  After surgery it may be brought to your room.  For patients admitted to the hospital, discharge time will be determined by your treatment team.  Patients discharged the day of surgery will not be allowed to drive home.   Special instructions:  Please bring your medications in their original pill bottles  Please read over the following fact sheets that you were given:       [facility] - Preparing for Surgery Before surgery, you can play an important role.  Because skin is not sterile, your skin needs to be as free of germs as possible.  You can reduce the number of germs on your skin by washing with CHG (chlorahexidine gluconate) soap before surgery.  CHG is an antiseptic cleaner which kills germs and bonds with the skin to continue killing germs even after washing. Please DO NOT use if you have an allergy to CHG or antibacterial soaps.  If your skin becomes reddened/irritated stop using the CHG and inform your nurse when you arrive at Short Stay. Do not shave (including legs and underarms) for at least 48 hours prior to the first CHG shower.  You may shave your face/neck. Please follow these instructions carefully:  1.  Shower with CHG Soap the night before surgery and the  morning of Surgery.  2.  If you choose to wash your hair, wash your hair first as usual with your  normal  shampoo.  3.  After you shampoo, rinse your hair and body thoroughly to remove the  shampoo.                            4.  Use CHG as you would any other liquid soap.  You can apply chg directly  to the skin and wash                       Gently with a scrungie  or clean washcloth.  5.  Apply the CHG Soap to your body ONLY FROM THE NECK DOWN.   Do not use on face/ open                           Wound or open sores. Avoid contact with eyes, ears mouth and genitals (private parts).                       Wash face,  Genitals (private parts) with your normal soap.             6.  Wash thoroughly, paying special attention to the area where your surgery  will be performed.  7.  Thoroughly rinse your body with warm water from the neck down.  8.  DO NOT shower/wash with your normal soap after using and rinsing off  the CHG Soap.                9.  Pat yourself dry with a clean towel.            10.  Wear clean pajamas.            11.  Place clean sheets on your bed the night of your first shower and do not  sleep with pets. Day of Surgery : Do not apply any lotions/deodorants the morning of surgery.  Please wear clean clothes to the hospital/surgery center.  FAILURE TO FOLLOW THESE INSTRUCTIONS MAY RESULT IN THE CANCELLATION OF YOUR SURGERY PATIENT SIGNATURE_________________________________  NURSE SIGNATURE__________________________________  ________________________________________________________________________  WHAT IS A BLOOD TRANSFUSION? Blood Transfusion Information  A transfusion is the replacement of blood or some of its parts. Blood is made up of multiple cells which provide different functions.  Red blood cells carry oxygen and are used for blood loss replacement.  White blood cells fight against infection.  Platelets control bleeding.  Plasma helps clot blood.  Other blood products are available for specialized needs, such as hemophilia or other clotting disorders. BEFORE THE TRANSFUSION  Who gives blood for transfusions?   Healthy volunteers who are fully evaluated to make sure their  blood is safe. This is blood bank blood. Transfusion therapy is the safest it has ever been in the practice of medicine. Before blood is taken from a donor, a complete history is taken to make sure that person has no history of diseases nor engages in risky social behavior (examples are intravenous drug use or sexual activity with multiple partners). The donor's travel history is screened to minimize risk of transmitting infections, such as malaria. The donated blood is tested for signs of infectious diseases, such as HIV and hepatitis. The blood is then tested to be sure it is compatible with you in order to minimize the chance of a transfusion reaction. If you or a relative donates blood, this is often done in anticipation of surgery and is not appropriate for emergency situations. It takes many days to process the donated blood. RISKS AND COMPLICATIONS Although transfusion therapy is very safe and saves many lives, the main dangers of transfusion include:   Getting an infectious disease.  Developing a transfusion reaction. This is an allergic reaction to something in the blood you were given. Every precaution is taken to prevent this. The decision to have a blood transfusion has been considered carefully by your caregiver before blood is given. Blood is not given unless the benefits outweigh the risks. AFTER THE  TRANSFUSION  Right after receiving a blood transfusion, you will usually feel much better and more energetic. This is especially true if your red blood cells have gotten low (anemic). The transfusion raises the level of the red blood cells which carry oxygen, and this usually causes an energy increase.  The nurse administering the transfusion will monitor you carefully for complications. HOME CARE INSTRUCTIONS  No special instructions are needed after a transfusion. You may find your energy is better. Speak with your caregiver about any limitations on activity for underlying diseases you  may have. SEEK MEDICAL CARE IF:   Your condition is not improving after your transfusion.  You develop redness or irritation at the intravenous (IV) site. SEEK IMMEDIATE MEDICAL CARE IF:  Any of the following symptoms occur over the next 12 hours:  Shaking chills.  You have a temperature by mouth above 102 F (38.9 C), not controlled by medicine.  Chest, back, or muscle pain.  People around you feel you are not acting correctly or are confused.  Shortness of breath or difficulty breathing.  Dizziness and fainting.  You get a rash or develop hives.  You have a decrease in urine output.  Your urine turns a dark color or changes to pink, red, or brown. Any of the following symptoms occur over the next 10 days:  You have a temperature by mouth above 102 F (38.9 C), not controlled by medicine.  Shortness of breath.  Weakness after normal activity.  The white part of the eye turns yellow (jaundice).  You have a decrease in the amount of urine or are urinating less often.  Your urine turns a dark color or changes to pink, red, or brown. Document Released: 02/28/2000 Document Revised: 05/25/2011 Document Reviewed: 10/17/2007 [facility] Patient Information 2014 [place], [place].  _______________________________________________________________________

## 2018-07-27 NOTE — Progress Notes (Signed)
Left voice message for a return call  SPOKE W/  PT     SCREENING SYMPTOMS OF COVID 19:   COUGH--No  RUNNY NOSE--- No  SORE THROAT---No  NASAL CONGESTION----NO  SNEEZING---No-  SHORTNESS OF BREATH---No  DIFFICULTY BREATHING---No  TEMP >100.0 -----No  UNEXPLAINED BODY ACHES------No  CHILLS --------No   HEADACHES ---------No  LOSS OF SMELL/ TASTE --------No    HAVE YOU OR ANY FAMILY MEMBER TRAVELLED PAST 14 DAYS OUT OF THE   COUNTY---NO STATE----No COUNTRY----NO  HAVE YOU OR ANY FAMILY MEMBER BEEN EXPOSED TO ANYONE WITH COVID 19? No

## 2018-07-28 ENCOUNTER — Other Ambulatory Visit (HOSPITAL_COMMUNITY)
Admission: RE | Admit: 2018-07-28 | Discharge: 2018-07-28 | Disposition: A | Discharge status: Home / Self Care | Payer: BLUE CROSS/BLUE SHIELD | Source: Ambulatory Visit | Attending: Obstetrics and Gynecology | Admitting: Obstetrics and Gynecology

## 2018-07-28 ENCOUNTER — Encounter (HOSPITAL_COMMUNITY)
Admission: RE | Admit: 2018-07-28 | Discharge: 2018-07-28 | Disposition: A | Discharge status: Home / Self Care | Payer: BLUE CROSS/BLUE SHIELD | Source: Ambulatory Visit | Attending: Obstetrics and Gynecology | Admitting: Obstetrics and Gynecology

## 2018-07-28 ENCOUNTER — Other Ambulatory Visit: Payer: Self-pay

## 2018-07-28 ENCOUNTER — Encounter (HOSPITAL_COMMUNITY): Payer: Self-pay

## 2018-07-28 DIAGNOSIS — M1990 Unspecified osteoarthritis, unspecified site: Secondary | ICD-10-CM | POA: Diagnosis not present

## 2018-07-28 DIAGNOSIS — Z01812 Encounter for preprocedural laboratory examination: Secondary | ICD-10-CM | POA: Diagnosis not present

## 2018-07-28 DIAGNOSIS — Z981 Arthrodesis status: Secondary | ICD-10-CM | POA: Diagnosis not present

## 2018-07-28 DIAGNOSIS — Z79899 Other long term (current) drug therapy: Secondary | ICD-10-CM | POA: Diagnosis not present

## 2018-07-28 DIAGNOSIS — D259 Leiomyoma of uterus, unspecified: Secondary | ICD-10-CM | POA: Diagnosis not present

## 2018-07-28 DIAGNOSIS — N814 Uterovaginal prolapse, unspecified: Secondary | ICD-10-CM | POA: Diagnosis not present

## 2018-07-28 DIAGNOSIS — Z87891 Personal history of nicotine dependence: Secondary | ICD-10-CM | POA: Diagnosis not present

## 2018-07-28 DIAGNOSIS — M797 Fibromyalgia: Secondary | ICD-10-CM | POA: Diagnosis not present

## 2018-07-28 DIAGNOSIS — N941 Unspecified dyspareunia: Secondary | ICD-10-CM | POA: Diagnosis not present

## 2018-07-28 DIAGNOSIS — Z1159 Encounter for screening for other viral diseases: Secondary | ICD-10-CM | POA: Diagnosis not present

## 2018-07-28 LAB — CBC
HCT: 38.3 % (ref 36.0–46.0)
Hemoglobin: 12.6 g/dL (ref 12.0–15.0)
MCH: 32 pg (ref 26.0–34.0)
MCHC: 32.9 g/dL (ref 30.0–36.0)
MCV: 97.2 fL (ref 80.0–100.0)
Platelets: 295 10*3/uL (ref 150–400)
RBC: 3.94 MIL/uL (ref 3.87–5.11)
RDW: 12.3 % (ref 11.5–15.5)
WBC: 5.3 10*3/uL (ref 4.0–10.5)
nRBC: 0 % (ref 0.0–0.2)

## 2018-07-28 LAB — ABO/RH: ABO/RH(D): B POS

## 2018-07-29 LAB — NOVEL CORONAVIRUS, NAA (HOSP ORDER, SEND-OUT TO REF LAB; TAT 18-24 HRS): SARS-CoV-2, NAA: NOT DETECTED

## 2018-07-29 NOTE — Progress Notes (Signed)
SPOKE W/  [place]    SCREENING SYMPTOMS OF COVID 19:   COUGH no  RUNNY NOSE no  SORE THROAT no  NASAL CONGESTION no  SNEEZING no  SHORTNESS OF BREATH no  DIFFICULTY BREATHING no  TEMP >100.0 no  UNEXPLAINED BODY ACHES no  CHILLS no  HEADACHES no  LOSS OF SMELL/ TASTE no    HAVE YOU OR ANY FAMILY MEMBER TRAVELLED PAST 14 DAYS OUT OF THE   COUNTY no STATE no COUNTRYno HAVE YOU OR ANY FAMILY MEMBER BEEN EXPOSED TO ANYONE WITH COVID 19? no

## 2018-07-30 ENCOUNTER — Other Ambulatory Visit: Payer: Self-pay | Admitting: Physician Assistant

## 2018-08-01 ENCOUNTER — Encounter (HOSPITAL_BASED_OUTPATIENT_CLINIC_OR_DEPARTMENT_OTHER): Payer: Self-pay

## 2018-08-01 ENCOUNTER — Observation Stay (HOSPITAL_BASED_OUTPATIENT_CLINIC_OR_DEPARTMENT_OTHER): Payer: BLUE CROSS/BLUE SHIELD | Admitting: Anesthesiology

## 2018-08-01 ENCOUNTER — Other Ambulatory Visit: Payer: Self-pay

## 2018-08-01 ENCOUNTER — Encounter (HOSPITAL_BASED_OUTPATIENT_CLINIC_OR_DEPARTMENT_OTHER)
Admission: RE | Disposition: A | Discharge status: Home / Self Care | Payer: Self-pay | Source: Home / Self Care | Attending: Obstetrics and Gynecology

## 2018-08-01 ENCOUNTER — Observation Stay (HOSPITAL_BASED_OUTPATIENT_CLINIC_OR_DEPARTMENT_OTHER): Payer: BLUE CROSS/BLUE SHIELD | Admitting: Physician Assistant

## 2018-08-01 ENCOUNTER — Observation Stay (HOSPITAL_BASED_OUTPATIENT_CLINIC_OR_DEPARTMENT_OTHER)
Admission: RE | Admit: 2018-08-01 | Discharge: 2018-08-02 | Disposition: A | Discharge status: Home / Self Care | Payer: BLUE CROSS/BLUE SHIELD | Attending: Obstetrics and Gynecology | Admitting: Obstetrics and Gynecology

## 2018-08-01 DIAGNOSIS — D259 Leiomyoma of uterus, unspecified: Secondary | ICD-10-CM | POA: Insufficient documentation

## 2018-08-01 DIAGNOSIS — Z1159 Encounter for screening for other viral diseases: Secondary | ICD-10-CM | POA: Diagnosis not present

## 2018-08-01 DIAGNOSIS — N819 Female genital prolapse, unspecified: Secondary | ICD-10-CM | POA: Diagnosis present

## 2018-08-01 DIAGNOSIS — N941 Unspecified dyspareunia: Secondary | ICD-10-CM | POA: Insufficient documentation

## 2018-08-01 DIAGNOSIS — Z79899 Other long term (current) drug therapy: Secondary | ICD-10-CM | POA: Diagnosis not present

## 2018-08-01 DIAGNOSIS — M797 Fibromyalgia: Secondary | ICD-10-CM | POA: Insufficient documentation

## 2018-08-01 DIAGNOSIS — N9419 Other specified dyspareunia: Secondary | ICD-10-CM | POA: Diagnosis not present

## 2018-08-01 DIAGNOSIS — Z87891 Personal history of nicotine dependence: Secondary | ICD-10-CM | POA: Insufficient documentation

## 2018-08-01 DIAGNOSIS — Z981 Arthrodesis status: Secondary | ICD-10-CM | POA: Insufficient documentation

## 2018-08-01 DIAGNOSIS — M5030 Other cervical disc degeneration, unspecified cervical region: Secondary | ICD-10-CM | POA: Diagnosis not present

## 2018-08-01 DIAGNOSIS — M1990 Unspecified osteoarthritis, unspecified site: Secondary | ICD-10-CM | POA: Insufficient documentation

## 2018-08-01 DIAGNOSIS — B977 Papillomavirus as the cause of diseases classified elsewhere: Secondary | ICD-10-CM | POA: Diagnosis not present

## 2018-08-01 DIAGNOSIS — N8189 Other female genital prolapse: Secondary | ICD-10-CM | POA: Diagnosis present

## 2018-08-01 DIAGNOSIS — N736 Female pelvic peritoneal adhesions (postinfective): Secondary | ICD-10-CM | POA: Diagnosis not present

## 2018-08-01 DIAGNOSIS — N814 Uterovaginal prolapse, unspecified: Principal | ICD-10-CM | POA: Insufficient documentation

## 2018-08-01 DIAGNOSIS — M5136 Other intervertebral disc degeneration, lumbar region: Secondary | ICD-10-CM | POA: Diagnosis not present

## 2018-08-01 HISTORY — PX: LAPAROSCOPIC VAGINAL HYSTERECTOMY WITH SALPINGECTOMY: SHX6680

## 2018-08-01 HISTORY — PX: ANTERIOR AND POSTERIOR REPAIR WITH SACROSPINOUS FIXATION: SHX6536

## 2018-08-01 LAB — POCT PREGNANCY, URINE: Preg Test, Ur: NEGATIVE

## 2018-08-01 LAB — TYPE AND SCREEN
ABO/RH(D): B POS
Antibody Screen: NEGATIVE

## 2018-08-01 SURGERY — HYSTERECTOMY, VAGINAL, LAPAROSCOPY-ASSISTED, WITH SALPINGECTOMY
Anesthesia: General

## 2018-08-01 MED ORDER — TRAMADOL HCL 50 MG PO TABS
50.0000 mg | ORAL_TABLET | Freq: Three times a day (TID) | ORAL | Status: DC
  Administered 2018-08-01 (×2): 50 mg via ORAL
  Filled 2018-08-01: qty 1

## 2018-08-01 MED ORDER — OXYCODONE-ACETAMINOPHEN 5-325 MG PO TABS
1.0000 | ORAL_TABLET | Freq: Four times a day (QID) | ORAL | Status: DC | PRN
  Administered 2018-08-01: 20:00:00 1 via ORAL
  Filled 2018-08-01: qty 2

## 2018-08-01 MED ORDER — KETOROLAC TROMETHAMINE 30 MG/ML IJ SOLN
30.0000 mg | Freq: Once | INTRAMUSCULAR | Status: DC
  Filled 2018-08-01: qty 1

## 2018-08-01 MED ORDER — ROCURONIUM BROMIDE 10 MG/ML (PF) SYRINGE
PREFILLED_SYRINGE | INTRAVENOUS | Status: DC | PRN
  Administered 2018-08-01: 45 mg via INTRAVENOUS
  Administered 2018-08-01: 5 mg via INTRAVENOUS

## 2018-08-01 MED ORDER — SUCCINYLCHOLINE CHLORIDE 20 MG/ML IJ SOLN
INTRAMUSCULAR | Status: DC | PRN
  Administered 2018-08-01: 100 mg via INTRAVENOUS

## 2018-08-01 MED ORDER — MENTHOL 3 MG MT LOZG
1.0000 | LOZENGE | OROMUCOSAL | Status: DC | PRN
  Filled 2018-08-01: qty 9

## 2018-08-01 MED ORDER — MORPHINE SULFATE (PF) 2 MG/ML IV SOLN
INTRAVENOUS | Status: AC
  Filled 2018-08-01: qty 1

## 2018-08-01 MED ORDER — DEXTROSE IN LACTATED RINGERS 5 % IV SOLN
INTRAVENOUS | Status: DC
  Administered 2018-08-01: 125 mL/h via INTRAVENOUS
  Filled 2018-08-01: qty 1000

## 2018-08-01 MED ORDER — PROPOFOL 10 MG/ML IV BOLUS
INTRAVENOUS | Status: AC
  Filled 2018-08-01: qty 40

## 2018-08-01 MED ORDER — MORPHINE SULFATE (PF) 2 MG/ML IV SOLN
1.0000 mg | INTRAVENOUS | Status: DC | PRN
  Administered 2018-08-01 (×2): 2 mg via INTRAVENOUS
  Filled 2018-08-01: qty 1

## 2018-08-01 MED ORDER — SODIUM CHLORIDE 0.9 % IV SOLN
2.0000 g | INTRAVENOUS | Status: AC
  Administered 2018-08-01: 2 g via INTRAVENOUS
  Filled 2018-08-01: qty 2

## 2018-08-01 MED ORDER — KETOROLAC TROMETHAMINE 30 MG/ML IJ SOLN
30.0000 mg | Freq: Four times a day (QID) | INTRAMUSCULAR | Status: DC
  Administered 2018-08-01 – 2018-08-02 (×4): 30 mg via INTRAVENOUS
  Filled 2018-08-01: qty 1

## 2018-08-01 MED ORDER — PROPOFOL 10 MG/ML IV BOLUS
INTRAVENOUS | Status: DC | PRN
  Administered 2018-08-01: 120 mg via INTRAVENOUS

## 2018-08-01 MED ORDER — BUPIVACAINE HCL (PF) 0.25 % IJ SOLN
INTRAMUSCULAR | Status: DC | PRN
  Administered 2018-08-01: 8 mL

## 2018-08-01 MED ORDER — OXYCODONE-ACETAMINOPHEN 5-325 MG PO TABS
ORAL_TABLET | ORAL | Status: AC
  Filled 2018-08-01: qty 1

## 2018-08-01 MED ORDER — KETOROLAC TROMETHAMINE 30 MG/ML IJ SOLN
INTRAMUSCULAR | Status: DC | PRN
  Administered 2018-08-01: 30 mg via INTRAVENOUS

## 2018-08-01 MED ORDER — DEXAMETHASONE SODIUM PHOSPHATE 10 MG/ML IJ SOLN
INTRAMUSCULAR | Status: AC
  Filled 2018-08-01: qty 1

## 2018-08-01 MED ORDER — FENTANYL CITRATE (PF) 250 MCG/5ML IJ SOLN
INTRAMUSCULAR | Status: AC
  Filled 2018-08-01: qty 5

## 2018-08-01 MED ORDER — KETOROLAC TROMETHAMINE 30 MG/ML IJ SOLN
INTRAMUSCULAR | Status: AC
  Filled 2018-08-01: qty 1

## 2018-08-01 MED ORDER — ACETAMINOPHEN 10 MG/ML IV SOLN
INTRAVENOUS | Status: AC
  Filled 2018-08-01: qty 100

## 2018-08-01 MED ORDER — SUCCINYLCHOLINE CHLORIDE 200 MG/10ML IV SOSY
PREFILLED_SYRINGE | INTRAVENOUS | Status: AC
  Filled 2018-08-01: qty 10

## 2018-08-01 MED ORDER — SUGAMMADEX SODIUM 200 MG/2ML IV SOLN
INTRAVENOUS | Status: DC | PRN
  Administered 2018-08-01: 200 mg via INTRAVENOUS

## 2018-08-01 MED ORDER — FENTANYL CITRATE (PF) 100 MCG/2ML IJ SOLN
INTRAMUSCULAR | Status: DC | PRN
  Administered 2018-08-01 (×2): 50 ug via INTRAVENOUS
  Administered 2018-08-01 (×2): 25 ug via INTRAVENOUS
  Administered 2018-08-01: 50 ug via INTRAVENOUS

## 2018-08-01 MED ORDER — ONDANSETRON HCL 4 MG/2ML IJ SOLN
INTRAMUSCULAR | Status: DC | PRN
  Administered 2018-08-01: 4 mg via INTRAVENOUS

## 2018-08-01 MED ORDER — HYDROCODONE-ACETAMINOPHEN 5-325 MG PO TABS
1.0000 | ORAL_TABLET | ORAL | Status: DC | PRN
  Filled 2018-08-01: qty 2

## 2018-08-01 MED ORDER — HYDROMORPHONE HCL 1 MG/ML IJ SOLN
INTRAMUSCULAR | Status: AC
  Filled 2018-08-01: qty 1

## 2018-08-01 MED ORDER — OXYCODONE HCL 5 MG/5ML PO SOLN
5.0000 mg | Freq: Once | ORAL | Status: DC | PRN
  Filled 2018-08-01: qty 5

## 2018-08-01 MED ORDER — ARTIFICIAL TEARS OPHTHALMIC OINT
TOPICAL_OINTMENT | OPHTHALMIC | Status: AC
  Filled 2018-08-01: qty 3.5

## 2018-08-01 MED ORDER — LACTATED RINGERS IV SOLN
INTRAVENOUS | Status: DC
  Administered 2018-08-01: 07:00:00 via INTRAVENOUS
  Filled 2018-08-01: qty 1000

## 2018-08-01 MED ORDER — ONDANSETRON HCL 4 MG/2ML IJ SOLN
INTRAMUSCULAR | Status: AC
  Filled 2018-08-01: qty 2

## 2018-08-01 MED ORDER — ROCURONIUM BROMIDE 10 MG/ML (PF) SYRINGE
PREFILLED_SYRINGE | INTRAVENOUS | Status: AC
  Filled 2018-08-01: qty 10

## 2018-08-01 MED ORDER — TRAMADOL HCL 50 MG PO TABS
ORAL_TABLET | ORAL | Status: AC
  Filled 2018-08-01: qty 1

## 2018-08-01 MED ORDER — PROMETHAZINE HCL 25 MG/ML IJ SOLN
6.2500 mg | INTRAMUSCULAR | Status: DC | PRN
  Filled 2018-08-01: qty 1

## 2018-08-01 MED ORDER — KETOROLAC TROMETHAMINE 30 MG/ML IJ SOLN
30.0000 mg | Freq: Once | INTRAMUSCULAR | Status: AC | PRN
  Filled 2018-08-01: qty 1

## 2018-08-01 MED ORDER — ONDANSETRON HCL 4 MG/2ML IJ SOLN
4.0000 mg | Freq: Four times a day (QID) | INTRAMUSCULAR | Status: DC | PRN
  Filled 2018-08-01: qty 2

## 2018-08-01 MED ORDER — HYDROMORPHONE HCL 1 MG/ML IJ SOLN
0.2500 mg | INTRAMUSCULAR | Status: DC | PRN
  Administered 2018-08-01 (×6): 0.25 mg via INTRAVENOUS
  Filled 2018-08-01: qty 0.5

## 2018-08-01 MED ORDER — LIDOCAINE 2% (20 MG/ML) 5 ML SYRINGE
INTRAMUSCULAR | Status: AC
  Filled 2018-08-01: qty 5

## 2018-08-01 MED ORDER — ONDANSETRON HCL 4 MG PO TABS
4.0000 mg | ORAL_TABLET | Freq: Four times a day (QID) | ORAL | Status: DC | PRN
  Filled 2018-08-01: qty 1

## 2018-08-01 MED ORDER — SODIUM CHLORIDE 0.9 % IV SOLN
INTRAVENOUS | Status: AC
  Filled 2018-08-01: qty 2

## 2018-08-01 MED ORDER — DEXAMETHASONE SODIUM PHOSPHATE 10 MG/ML IJ SOLN
INTRAMUSCULAR | Status: DC | PRN
  Administered 2018-08-01: 5 mg via INTRAVENOUS

## 2018-08-01 MED ORDER — OXYCODONE-ACETAMINOPHEN 5-325 MG PO TABS
1.0000 | ORAL_TABLET | ORAL | Status: DC | PRN
  Administered 2018-08-01: 1 via ORAL
  Administered 2018-08-01 – 2018-08-02 (×3): 2 via ORAL
  Filled 2018-08-01: qty 2

## 2018-08-01 MED ORDER — LIDOCAINE 2% (20 MG/ML) 5 ML SYRINGE
INTRAMUSCULAR | Status: DC | PRN
  Administered 2018-08-01: 100 mg via INTRAVENOUS

## 2018-08-01 MED ORDER — ACETAMINOPHEN 10 MG/ML IV SOLN
INTRAVENOUS | Status: DC | PRN
  Administered 2018-08-01: 1000 mg via INTRAVENOUS

## 2018-08-01 MED ORDER — SODIUM CHLORIDE 0.9 % IV SOLN
2.0000 g | INTRAVENOUS | Status: AC
  Filled 2018-08-01: qty 2

## 2018-08-01 MED ORDER — MIDAZOLAM HCL 2 MG/2ML IJ SOLN
INTRAMUSCULAR | Status: DC | PRN
  Administered 2018-08-01: 2 mg via INTRAVENOUS

## 2018-08-01 MED ORDER — OXYCODONE-ACETAMINOPHEN 5-325 MG PO TABS
ORAL_TABLET | ORAL | Status: AC
  Filled 2018-08-01: qty 2

## 2018-08-01 MED ORDER — OXYCODONE HCL 5 MG PO TABS
5.0000 mg | ORAL_TABLET | Freq: Once | ORAL | Status: DC | PRN
  Filled 2018-08-01: qty 1

## 2018-08-01 MED ORDER — METHOCARBAMOL 500 MG PO TABS
500.0000 mg | ORAL_TABLET | Freq: Two times a day (BID) | ORAL | Status: DC
  Administered 2018-08-01: 500 mg via ORAL
  Filled 2018-08-01: qty 1

## 2018-08-01 MED ORDER — MIDAZOLAM HCL 2 MG/2ML IJ SOLN
INTRAMUSCULAR | Status: AC
  Filled 2018-08-01: qty 2

## 2018-08-01 MED ORDER — KETOROLAC TROMETHAMINE 30 MG/ML IJ SOLN
30.0000 mg | Freq: Four times a day (QID) | INTRAMUSCULAR | Status: DC
  Filled 2018-08-01: qty 1

## 2018-08-01 MED ORDER — SUGAMMADEX SODIUM 200 MG/2ML IV SOLN
INTRAVENOUS | Status: AC
  Filled 2018-08-01: qty 2

## 2018-08-01 SURGICAL SUPPLY — 47 items
CABLE HIGH FREQUENCY MONO STRZ (ELECTRODE) IMPLANT
CATH ROBINSON RED A/P 16FR (CATHETERS) ×3 IMPLANT
CHLORAPREP W/TINT 26 (MISCELLANEOUS) ×3 IMPLANT
CONT PATH 16OZ SNAP LID 3702 (MISCELLANEOUS) IMPLANT
COVER BACK TABLE 60X90IN (DRAPES) ×3 IMPLANT
COVER MAYO STAND STRL (DRAPES) ×6 IMPLANT
COVER SURGICAL LIGHT HANDLE (MISCELLANEOUS) ×3 IMPLANT
COVER WAND RF STERILE (DRAPES) ×3 IMPLANT
DECANTER SPIKE VIAL GLASS SM (MISCELLANEOUS) ×3 IMPLANT
DERMABOND ADVANCED (GAUZE/BANDAGES/DRESSINGS) ×1
DERMABOND ADVANCED .7 DNX12 (GAUZE/BANDAGES/DRESSINGS) ×2 IMPLANT
DRSG COVADERM PLUS 2X2 (GAUZE/BANDAGES/DRESSINGS) ×3 IMPLANT
DRSG OPSITE POSTOP 3X4 (GAUZE/BANDAGES/DRESSINGS) IMPLANT
DURAPREP 26ML APPLICATOR (WOUND CARE) IMPLANT
ELECT REM PT RETURN 9FT ADLT (ELECTROSURGICAL) ×3
ELECTRODE REM PT RTRN 9FT ADLT (ELECTROSURGICAL) ×2 IMPLANT
GAUZE 4X4 16PLY RFD (DISPOSABLE) ×3 IMPLANT
GLOVE BIO SURGEON STRL SZ7 (GLOVE) ×6 IMPLANT
GLOVE BIOGEL PI IND STRL 7.0 (GLOVE) ×4 IMPLANT
GLOVE BIOGEL PI INDICATOR 7.0 (GLOVE) ×2
GLOVE ECLIPSE 6.5 STRL STRAW (GLOVE) ×3 IMPLANT
HOLDER FOLEY CATH W/STRAP (MISCELLANEOUS) ×3 IMPLANT
LIGASURE IMPACT 36 18CM CVD LR (INSTRUMENTS) IMPLANT
NEEDLE INSUFFLATION 120MM (ENDOMECHANICALS) ×3 IMPLANT
NS IRRIG 1000ML POUR BTL (IV SOLUTION) ×3 IMPLANT
PACK LAVH (CUSTOM PROCEDURE TRAY) ×3 IMPLANT
PACK ROBOTIC GOWN (GOWN DISPOSABLE) IMPLANT
PACK TRENDGUARD 450 HYBRID PRO (MISCELLANEOUS) IMPLANT
PROTECTOR NERVE ULNAR (MISCELLANEOUS) ×6 IMPLANT
SEALER TISSUE G2 CVD JAW 45CM (ENDOMECHANICALS) ×3 IMPLANT
SET IRRIG TUBING LAPAROSCOPIC (IRRIGATION / IRRIGATOR) IMPLANT
SUT MON AB 2-0 CT1 36 (SUTURE) IMPLANT
SUT VIC AB 0 CT1 18XCR BRD8 (SUTURE) ×6 IMPLANT
SUT VIC AB 0 CT1 36 (SUTURE) ×3 IMPLANT
SUT VIC AB 0 CT1 8-18 (SUTURE) ×3
SUT VIC AB 2-0 CT1 (SUTURE) IMPLANT
SUT VICRYL 0 TIES 12 18 (SUTURE) ×3 IMPLANT
SUT VICRYL 4-0 PS2 18IN ABS (SUTURE) ×3 IMPLANT
TOWEL OR 17X26 10 PK STRL BLUE (TOWEL DISPOSABLE) ×6 IMPLANT
TRAY FOLEY W/BAG SLVR 14FR (SET/KITS/TRAYS/PACK) ×3 IMPLANT
TRENDGUARD 450 HYBRID PRO PACK (MISCELLANEOUS)
TROCAR BLADELESS OPT 5 100 (ENDOMECHANICALS) ×3 IMPLANT
TROCAR OPTI TIP 5M 100M (ENDOMECHANICALS) ×3 IMPLANT
TROCAR XCEL DIL TIP R 11M (ENDOMECHANICALS) ×3 IMPLANT
TUBING EVAC SMOKE HEATED PNEUM (TUBING) ×3 IMPLANT
TUBING SUCTION 1/4X6FT (MISCELLANEOUS) ×3 IMPLANT
WARMER LAPAROSCOPE (MISCELLANEOUS) ×3 IMPLANT

## 2018-08-01 NOTE — Op Note (Signed)
Preoperative diagnosis: Symptomatic uterine prolapse with cystocele, dyspareunia Postoperative diagnosis same plus mild periadnexal adhesions Procedure: Diagnostic laparoscopy, followed by LAVH, lysis of adhesions, anterior repair, McCall's culdoplasty Surgeon: Bonnita Nasuti Assistant: [place] EBL: 75 cc Procedure and findings:  The patient was taken to the operating room after an adequate level of general anesthesia was obtained with the patient's legs in stirrups the abdomen perineum and vagina were prepped and draped in the bladder was drained.  Appropriate timeouts were taken at that point.  EUA was carried out the uterus was normal size, moderate descensus was noted with a small cystocele under anesthesia her posterior and upper support looked normal.  After prepping and draping Foley catheter positioned to drain the bladder attention directed to the abdomen  The subumbilical area was infiltrated with quarter percent Marcaine plain small incision was made, and the varies needle was introduced without difficulty.  Its intra-abdominal position was verified by pressure and water testing.  A 2-1/2 L pneumoperitoneum was then created, laparoscopic trocar and sleeve were then introduced that difficulty.  There was no evidence of any bleeding or trauma.  3 fingerbreadths above the symphysis in the midline a 5 mm trocar was inserted under direct visualization.  Pelvic findings as follows:  Uterus itself was normal size right tube and ovary were normal there was a solitary adhesion between small bowel and the posterior uterine wall on the right on the left there were some distal periadnexal adhesions that were lysed in an avascular plane freeing up that left tube and ovary.  Using an atraumatic grasper the distal right tube was placed on traction, the Enseal device was used to coagulate and divide the mesosalpinx up to the uterus, the utero-ovarian ligament and round ligament were also coagulated and divided.  Thus,  the right ovary was conserved.  After freeing up the left tube and ovary the same procedure repeated on that side conserving the left ovary.  This time the vaginal portion of the procedure started  Legs were extended, weighted speculum was positioned cervix grasped with a tenaculum moderate descensus was noted the cervical vaginal mucosa was incised posterior cordotomy performed the difficulty the bladder was advanced superiorly with sharp and blunt dissection until the anterior peritoneal reflection could be identified this was entered sharply and retracted retractor then used to gently elevate the bladder out of the field.  In sequential manner the uterosacral ligament cardinal ligament and uterine vasculature pedicles and upper broad ligament pedicles were clamped divided and suture ligated with 0 Vicryl suture.  The fundus of the uterus was then delivered posteriorly remaining pedicles were clamped divided first free tie of a followed by suture ligature of 0 Vicryl.  Vaginal cuff was then closed from 3-9 o'clock with a running locked suture.  This was hemostatic McCall's culdoplasty was carried out picking up left uterosacral ligament posterior peritoneum across to the right uterosacral ligament which was tied down for extra posterior support.  Prior to closure sponge count, needle, instrument counts reported as correct x2.  Vaginal cuff was then closed right to left with interrupted 2-0 Vicryl sutures.  Attention directed to the anterior repair  The mid urethral area in the midline was placed on traction with Allis clamps, the anterior vaginal mucosa was divided in the midline, using sharp and blunt dissection this was dissected free, this was only a mild cystocele noted.  Once the cystocele was reduced the perivesical fascia was plicated midline with 0 Vicryl interrupted sutures a small amount of vaginal mucosa  was trimmed and reapproximated in the midline with 2-0 Vicryl sutures.  Foley catheter was in  position draining clear urine.  The vaginal cuff was hemostatic.  Repeat laparoscopy at full and then reduce pressure revealed that the operative site was hemostatic.  Instruments were removed, gas allowed to escape the upper incision closed with a 2-0 Vicryl subcuticular suture Dermabond on the lower she tolerated this well went to recovery room in good condition.  Dictated with Dragon medical 1  Margarette Asal MD

## 2018-08-01 NOTE — Progress Notes (Signed)
08/01/2018 7:15 PM Pt. Home pain management medication schedule was reordered by Dr. Matthew Saras earlier this afternoon. Pt. States that she feels that this has been insufficient in managing her post operative pain. She states that the PRN morphine has given her brief episodes of relief but it quickly resumes and she would like something else to manage her post op pain. On-call MD paged. Dr. Gaetano Net returned call and was made aware of pt. Concerns regarding pain management. Verbal orders received to d/c Tramadol, ok to continue Robaxin per home schedule and orders received for Percocet 5/325 1-2 tabs Q6H PRN pain. Orders enacted. Pt. Updated on plan of care. Will continue to closely monitor patient.  Nikolas Casher, Arville Lime

## 2018-08-01 NOTE — Anesthesia Preprocedure Evaluation (Addendum)
Anesthesia Evaluation  Patient identified by MRN, date of birth, ID band Patient awake    Reviewed: Allergy & Precautions, NPO status , Patient's Chart, lab work & pertinent test results  Airway Mallampati: II  TM Distance: >3 FB Neck ROM: Full    Dental no notable dental hx. (+) Teeth Intact, Dental Advisory Given   Pulmonary neg pulmonary ROS, former smoker,    Pulmonary exam normal breath sounds clear to auscultation       Cardiovascular negative cardio ROS Normal cardiovascular exam Rhythm:Regular Rate:Normal     Neuro/Psych  Headaches, negative psych ROS   GI/Hepatic negative GI ROS, Neg liver ROS,   Endo/Other  negative endocrine ROS  Renal/GU negative Renal ROS  negative genitourinary   Musculoskeletal  (+) Arthritis , Osteoarthritis,  Fibromyalgia -S/P ACDF w/ fusion   Abdominal   Peds negative pediatric ROS (+)  Hematology negative hematology ROS (+)   Anesthesia Other Findings   Reproductive/Obstetrics negative OB ROS                           Anesthesia Physical Anesthesia Plan  ASA: II  Anesthesia Plan: General   Post-op Pain Management:    Induction: Intravenous  PONV Risk Score and Plan: 3 and Ondansetron, Dexamethasone, Treatment may vary due to age or medical condition, Midazolam and Scopolamine patch - Pre-op  Airway Management Planned: Oral ETT  Additional Equipment:   Intra-op Plan:   Post-operative Plan: Extubation in OR  Informed Consent: I have reviewed the patients History and Physical, chart, labs and discussed the procedure including the risks, benefits and alternatives for the proposed anesthesia with the patient or authorized representative who has indicated his/her understanding and acceptance.     Dental advisory given  Plan Discussed with: CRNA and Surgeon  Anesthesia Plan Comments:         Anesthesia Quick Evaluation

## 2018-08-01 NOTE — Anesthesia Postprocedure Evaluation (Signed)
Anesthesia Post Note  Patient: [name]  Procedure(s) Performed: LAPAROSCOPIC ASSISTED VAGINAL HYSTERECTOMY WITH SALPINGECTOMY (Bilateral ) ANTERIOR REPAIR (N/A )     Patient location during evaluation: PACU Anesthesia Type: General Level of consciousness: awake and alert Pain management: pain level controlled Vital Signs Assessment: post-procedure vital signs reviewed and stable Respiratory status: spontaneous breathing, nonlabored ventilation, respiratory function stable and patient connected to nasal cannula oxygen Cardiovascular status: blood pressure returned to baseline and stable Postop Assessment: no apparent nausea or vomiting Anesthetic complications: no    Last Vitals:  Vitals:   08/01/18 0930 08/01/18 0936  BP: 130/74   Pulse: 89 81  Resp: 16 12  Temp:    SpO2: 100% 100%    Last Pain:  Vitals:   08/01/18 0936  TempSrc:   PainSc: 5                  Maxx Calaway S

## 2018-08-01 NOTE — Progress Notes (Signed)
08/01/2018 1100 Pt. States that she takes Tramadol for her fibromyalgia 50 MG TID scheduled as well as Robaxin 500 mg BID scheduled. Dr. Matthew Saras paged and made aware. Verbal order received ok for patient to continue home regimen and to d/c Vicodin. Orders enacted. Pt. Updated on plan of care. Will continue to closely monitor patient.  Giulio Bertino, Arville Lime

## 2018-08-01 NOTE — Anesthesia Procedure Notes (Signed)
Procedure Name: Intubation Date/Time: 08/01/2018 7:36 AM Performed by: Wanita Chamberlain, CRNA Pre-anesthesia Checklist: Patient identified, Emergency Drugs available, Suction available, Patient being monitored and Timeout performed Patient Re-evaluated:Patient Re-evaluated prior to induction Oxygen Delivery Method: Circle system utilized Preoxygenation: Pre-oxygenation with 100% oxygen Induction Type: IV induction Ventilation: Mask ventilation without difficulty Grade View: Grade II Tube type: Oral Tube size: 7.0 mm Number of attempts: 1 Airway Equipment and Method: Stylet Placement Confirmation: breath sounds checked- equal and bilateral,  CO2 detector,  positive ETCO2 and ETT inserted through vocal cords under direct vision Secured at: 21 cm Tube secured with: Tape Dental Injury: Teeth and Oropharynx as per pre-operative assessment

## 2018-08-01 NOTE — Progress Notes (Signed)
The patient was re-examined with no change in status 

## 2018-08-01 NOTE — Telephone Encounter (Signed)
Last Visit: 04/19/18 Next Visit: 10/18/18 UDS: 07/12/18  Narc Agreement: 07/12/18  Okay to refill Tramadol?

## 2018-08-01 NOTE — Transfer of Care (Signed)
Immediate Anesthesia Transfer of Care Note  Patient: [name]  Procedure(s) Performed: LAPAROSCOPIC ASSISTED VAGINAL HYSTERECTOMY WITH SALPINGECTOMY (Bilateral ) ANTERIOR AND POSTERIOR REPAIR WITH SACROSPINOUS FIXATION (N/A )  Patient Location: PACU  Anesthesia Type:General  Level of Consciousness: awake, alert , oriented and patient cooperative  Airway & Oxygen Therapy: Patient Spontanous Breathing and Patient connected to nasal cannula oxygen  Post-op Assessment: Report given to RN and Post -op Vital signs reviewed and stable  Post vital signs: Reviewed and stable  Last Vitals:  Vitals Value Taken Time  BP    Temp    Pulse 76 08/01/2018  9:01 AM  Resp    SpO2 100 % 08/01/2018  9:01 AM  Vitals shown include unvalidated device data.  Last Pain:  Vitals:   08/01/18 0556  TempSrc: Oral  PainSc: 0-No pain      Patients Stated Pain Goal: 5 (70/[id number])  Complications: No apparent anesthesia complications

## 2018-08-01 NOTE — Progress Notes (Signed)
08/01/2018 7:35 PM Dr. Matthew Saras returned call and made aware of orders given by Dr. Gaetano Net. Verbal order received ok for Percocet to be administered q 4-6 h prn. Orders updated. Will continue to closely monitor patient.  Jaymarion Trombly, Arville Lime

## 2018-08-02 ENCOUNTER — Encounter (HOSPITAL_BASED_OUTPATIENT_CLINIC_OR_DEPARTMENT_OTHER): Payer: Self-pay | Admitting: Obstetrics and Gynecology

## 2018-08-02 DIAGNOSIS — Z79899 Other long term (current) drug therapy: Secondary | ICD-10-CM | POA: Diagnosis not present

## 2018-08-02 DIAGNOSIS — Z981 Arthrodesis status: Secondary | ICD-10-CM | POA: Diagnosis not present

## 2018-08-02 DIAGNOSIS — M1990 Unspecified osteoarthritis, unspecified site: Secondary | ICD-10-CM | POA: Diagnosis not present

## 2018-08-02 DIAGNOSIS — N814 Uterovaginal prolapse, unspecified: Secondary | ICD-10-CM | POA: Diagnosis not present

## 2018-08-02 DIAGNOSIS — D259 Leiomyoma of uterus, unspecified: Secondary | ICD-10-CM | POA: Diagnosis not present

## 2018-08-02 DIAGNOSIS — Z1159 Encounter for screening for other viral diseases: Secondary | ICD-10-CM | POA: Diagnosis not present

## 2018-08-02 DIAGNOSIS — N941 Unspecified dyspareunia: Secondary | ICD-10-CM | POA: Diagnosis not present

## 2018-08-02 DIAGNOSIS — Z87891 Personal history of nicotine dependence: Secondary | ICD-10-CM | POA: Diagnosis not present

## 2018-08-02 DIAGNOSIS — M797 Fibromyalgia: Secondary | ICD-10-CM | POA: Diagnosis not present

## 2018-08-02 LAB — CBC
HCT: 32.4 % — ABNORMAL LOW (ref 36.0–46.0)
Hemoglobin: 10.3 g/dL — ABNORMAL LOW (ref 12.0–15.0)
MCH: 31.7 pg (ref 26.0–34.0)
MCHC: 31.8 g/dL (ref 30.0–36.0)
MCV: 99.7 fL (ref 80.0–100.0)
Platelets: 233 10*3/uL (ref 150–400)
RBC: 3.25 MIL/uL — ABNORMAL LOW (ref 3.87–5.11)
RDW: 12.4 % (ref 11.5–15.5)
WBC: 11.4 10*3/uL — ABNORMAL HIGH (ref 4.0–10.5)
nRBC: 0 % (ref 0.0–0.2)

## 2018-08-02 MED ORDER — OXYCODONE-ACETAMINOPHEN 5-325 MG PO TABS
ORAL_TABLET | ORAL | Status: AC
  Filled 2018-08-02: qty 2

## 2018-08-02 MED ORDER — OXYCODONE-ACETAMINOPHEN 5-325 MG PO TABS: 1.0000 | ORAL_TABLET | Freq: Four times a day (QID) | ORAL | 0 refills | Status: DC | PRN

## 2018-08-02 MED ORDER — KETOROLAC TROMETHAMINE 30 MG/ML IJ SOLN
INTRAMUSCULAR | Status: AC
  Filled 2018-08-02: qty 1

## 2018-08-02 NOTE — Discharge Summary (Signed)
Physician Discharge Summary  Patient ID: [name] MRN: [id number] DOB/AGE: 51-Jun-1967 51 y.o.  Admit date: 08/01/2018 Discharge date: 08/02/2018  Admission Diagnoses:uterine prolapse, cystocele, dysparunia  Discharge Diagnoses: same Active Problems:   Pelvic relaxation   Prolapse of female pelvic organs   Discharged Condition: good  Hospital Course: admf for LAVH/BS and ant repair, was D/C on POD 1, tol PO, and afeb  Consults: None  Significant Diagnostic Studies: labs:  Results for orders placed or performed during the hospital encounter of 08/01/18 (from the past 24 hour(s))  CBC     Status: Abnormal   Collection Time: 08/02/18  5:05 AM  Result Value Ref Range   WBC 11.4 (H) 4.0 - 10.5 K/uL   RBC 3.25 (L) 3.87 - 5.11 MIL/uL   Hemoglobin 10.3 (L) 12.0 - 15.0 g/dL   HCT 32.4 (L) 36.0 - 46.0 %   MCV 99.7 80.0 - 100.0 fL   MCH 31.7 26.0 - 34.0 pg   MCHC 31.8 30.0 - 36.0 g/dL   RDW 12.4 11.5 - 15.5 %   Platelets 233 150 - 400 K/uL   nRBC 0.0 0.0 - 0.2 %    Treatments: surgery: LAVH/BS and ANT repair  Discharge Exam: Blood pressure 115/75, pulse 81, temperature 98.2 F (36.8 C), resp. rate 16, height 5\' 3"  (1.6 m), weight 53.3 kg, SpO2 100 %. General appearance: alert  Abd Incs C/D, soft, nontender  Disposition: Discharge disposition: 01-Home or Self Care        Allergies as of 08/02/2018      Reactions   Lyrica [pregabalin]    Altered mental state      Medication List    STOP taking these medications   metroNIDAZOLE 0.75 % vaginal gel Commonly known as:  METROGEL   nystatin cream Commonly known as:  MYCOSTATIN     TAKE these medications   calcium carbonate 500 MG chewable tablet Commonly known as:  TUMS - dosed in mg elemental calcium Chew 2-4 tablets by mouth daily as needed for indigestion or heartburn.   ibuprofen 200 MG tablet Commonly known as:  ADVIL Take 600 mg by mouth See admin instructions. Take 600 mg at bedtime, may take an  additional 600 mg dose as needed for pain   IRON PO Take 85 mg by mouth daily with lunch.   Magnesium 400 MG Tabs Take 400 mg by mouth 4 (four) times daily.   methocarbamol 500 MG tablet Commonly known as:  ROBAXIN TAKE 1 TABLET (500 MG TOTAL) BY MOUTH 2 (TWO) TIMES DAILY AS NEEDED FOR MUSCLE SPASMS. What changed:  when to take this   MUCUS RELIEF SEVERE CONG/COLD PO Take 1 tablet by mouth 4 (four) times daily as needed (allergies).   NETI POT SINUS [place] NA Place 1 Dose into the nose daily as needed (congestion).   oxyCODONE-acetaminophen 5-325 MG tablet Commonly known as:  PERCOCET/ROXICET Take 1-2 tablets by mouth every 6 (six) hours as needed for moderate pain.   tiZANidine 4 MG tablet Commonly known as:  ZANAFLEX TAKE 1 TABLET BY MOUTH EVERYDAY AT BEDTIME What changed:  See the new instructions.   topiramate 25 MG tablet Commonly known as:  TOPAMAX TAKE 2 TABLETS BY MOUTH AT BEDTIME   traMADol 50 MG tablet Commonly known as:  ULTRAM TAKE 1 TO 2 TABLETS BY MOUTH TWICE DAILY AS NEEDED What changed:  See the new instructions.   triamcinolone cream 0.1 % Commonly known as:  KENALOG Apply 1 application topically daily  as needed (yeast).      Follow-up Information    Molli Posey, MD. Schedule an appointment as soon as possible for a visit in 1 week(s).   Specialty:  Obstetrics and Gynecology Contact information: [address] [place] [address] [phone]           Signed: Margarette Asal 08/02/2018, 7:46 AM

## 2018-08-17 ENCOUNTER — Other Ambulatory Visit: Payer: Self-pay | Admitting: Rheumatology

## 2018-08-17 NOTE — Telephone Encounter (Signed)
Last Visit: 04/19/18 Next Visit: 10/18/18  Okay to refill per Dr. Estanislado Pandy

## 2018-08-24 ENCOUNTER — Ambulatory Visit (INDEPENDENT_AMBULATORY_CARE_PROVIDER_SITE_OTHER): Payer: BC Managed Care – PPO | Admitting: Specialist

## 2018-08-24 ENCOUNTER — Encounter: Payer: Self-pay | Admitting: Specialist

## 2018-08-24 ENCOUNTER — Other Ambulatory Visit: Payer: Self-pay

## 2018-08-24 VITALS — BP 112/75 | HR 94 | Ht 63.0 in | Wt 116.0 lb

## 2018-08-24 DIAGNOSIS — M7061 Trochanteric bursitis, right hip: Secondary | ICD-10-CM

## 2018-08-24 DIAGNOSIS — M461 Sacroiliitis, not elsewhere classified: Secondary | ICD-10-CM | POA: Diagnosis not present

## 2018-08-24 NOTE — Progress Notes (Signed)
Office Visit Note   Patient: [name]           Date of Birth: 09-12-1965           MRN: [id number] Visit Date: 08/24/2018              Requested by: Leighton Ruff, MD [place], [place] [address] PCP: Leighton Ruff, MD   Assessment & Plan: Visit Diagnoses:  1. Sacroiliitis, not elsewhere classified ([place])   2. Greater trochanteric bursitis of right hip     Plan: For patient's chronic right SI joint pain treated with injections several years ago and most recently February 2020 I think it would be beneficial to repeat that injection with Dr. Ernestina Patches since she did get good diagnostic relief.  I will also get him to inject her right greater trochanteric bursa.  Advised patient to pay close attention to how she feels immediately after these injections.  Follow-up with Dr. Louanne Skye a couple weeks after she has had the injections to check her response.  We did discuss possible scheduling of MRI pelvis depend upon how she feels.  Patient also describes symptoms of right lower extremity neuropathy and also areas of vague spasms and I also think that she would benefit from a neurology referral.  She would like to hold off on the MRI and referral at this time.  She can continue medications from her rheumatologist.  Follow-Up Instructions: Return in about 3 weeks (around 09/14/2018) for with Dr Louanne Skye after right SI joint and right GT bursa injection.   Orders:  Orders Placed This Encounter  Procedures  . Ambulatory referral to Physical Medicine Rehab   No orders of the defined types were placed in this encounter.     Procedures: No procedures performed   Clinical Data: No additional findings.   Subjective: Chief Complaint  Patient presents with  . Lower Back - Pain    HPI 53 year old patient who is new to our office comes in with complaints of chronic right SI joint pain and right hip pain.  Patient is followed by rheumatologist Dr. Estanislado Pandy and has been  diagnosed with fibromyalgia.  Has history of right sacroiliitis and has had injections there by Dr. Ernestina Patches several years ago and most recently February 2020.  She states that first injection gave excellent relief.  She also did get good diagnostic relief with the most recent injection but pain has returned.  Patient recently had GYN surgery with LAVH positioned in stirrups May 2020.  It sounds like this may have aggravated her right SI joint and also feeling of right anterior hip stiffness.  Patient has had some complaints of radicular type pain down the right leg to her calf area.  She did have lumbar MRI performed April 29, 2018 and that report showed:  EXAM: MRI LUMBAR SPINE WITHOUT CONTRAST  TECHNIQUE: Multiplanar, multisequence MR imaging of the lumbar spine was performed. No intravenous contrast was administered.  COMPARISON:  Radiography 04/19/2018.  MRI 02/03/2010.  FINDINGS: Segmentation:  5 lumbar type vertebral bodies.  Alignment:  Normal  Vertebrae:  Normal  Conus medullaris and cauda equina: Conus extends to the L2 level. Conus and cauda equina appear normal.  Paraspinal and other soft tissues: Negative  Disc levels:  No abnormality at T12-L1, L1-2 or L2-3.  L3-4: Mild desiccation and bulging of the disc. Mild facet and ligamentous hypertrophy. No compressive stenosis.  L4-5 and L5-S1: Normal.  No disc or facet pathology.  No stenosis.  Compared to the study of 2011, the findings are quite similar. There is not been any significant progression of degenerative change at L3-4.  IMPRESSION: Very similar appearance to the study of 2011. Mild degenerative change at L3-4 with mild desiccation and bulging of the disc. Mild facet hypertrophy. No apparent compressive stenosis. The other levels are normal.  There is nothing on the study to warrant lumbar ESI.  Patient describes having some scapular spasms extending into the thoracic area.  Status post C5-6  fusion by Dr. Legrand Como took in 2007.  I do not see any recent imaging studies since that surgery.  Patient declined imaging today.  Not describing any upper extremity radicular issues.  Currently taking chronic Ultram and muscle relaxer from rheumatologist.   Review of Systems Reports history of migraines.  No current cardiac pulmonary GI GU issues  Objective: Vital Signs: BP 112/75 (BP Location: Left Arm, Patient Position: Sitting)   Pulse 94   Ht 5\' 3"  (1.6 m)   Wt 116 lb (52.6 kg)   BMI 20.55 kg/m   Physical Exam HENT:     Head: Normocephalic.  Pulmonary:     Effort: No respiratory distress.  Musculoskeletal:     Comments: Patient has moderate to marked tenderness over the right SI joint.  Less tender over the left side.  Moderate to marked tenderness over the right hip greater trochanter bursa.  Less tender over the left.  Negative logroll bilateral hips.  She does have pain in the right low back with right hip flexion and external rotation in the seated position.  Negative on the left side.  Negative straight leg raise.  Neuro vas intact.  No focal motor deficits.  No scapular tenderness.  Good cervical spine range of motion.  Neurological:     General: No focal deficit present.     Mental Status: She is alert.     Ortho Exam  Specialty Comments:  No specialty comments available.  Imaging: No results found.   PMFS History: Patient Active Problem List   Diagnosis Date Noted  . Pelvic relaxation 08/01/2018  . Prolapse of female pelvic organs 08/01/2018  . Other fatigue 10/01/2016  . Primary insomnia 10/01/2016  . History of migraine 10/01/2016  . DDD (degenerative disc disease), lumbar 04/08/2016  . DDD (degenerative disc disease), cervical 04/08/2016  . Osteoarthritis, hand 04/08/2016  . Fibromyalgia   . Intervertebral disk disease   . HA (headache)    Past Medical History:  Diagnosis Date  . Esophageal spasm 2019  . Fibromyalgia   . Intervertebral disk  disease   . Migraine     Family History  Problem Relation Age of Onset  . Breast cancer Neg Hx     Past Surgical History:  Procedure Laterality Date  . ANTERIOR AND POSTERIOR REPAIR WITH SACROSPINOUS FIXATION N/A 08/01/2018   Procedure: ANTERIOR REPAIR;  Surgeon: Molli Posey, MD;  Location: [facility];  Service: Gynecology;  Laterality: N/A;  . APPENDECTOMY    . CERVICAL FUSION    . CESAREAN SECTION    . HEMORRHOID SURGERY    . LAPAROSCOPIC VAGINAL HYSTERECTOMY WITH SALPINGECTOMY Bilateral 08/01/2018   Procedure: LAPAROSCOPIC ASSISTED VAGINAL HYSTERECTOMY WITH SALPINGECTOMY;  Surgeon: Molli Posey, MD;  Location: [facility];  Service: Gynecology;  Laterality: Bilateral;  need bed  . UTERINE POLYPS  2019   Social History   Occupational History  . Not on file  Tobacco Use  . Smoking status: Former Smoker  Years: 10.00    Types: E-cigarettes  . Smokeless tobacco: Never Used  Substance and Sexual Activity  . Alcohol use: Not Currently  . Drug use: No  . Sexual activity: Not on file

## 2018-09-01 ENCOUNTER — Other Ambulatory Visit: Payer: Self-pay | Admitting: Physician Assistant

## 2018-09-01 NOTE — Telephone Encounter (Signed)
Last Visit: 04/19/18 Next Visit: 10/18/18 UDS: 07/12/18 Narc Agreement: 07/12/18  Okay to refill Tramadol?

## 2018-09-09 ENCOUNTER — Encounter: Payer: Self-pay | Admitting: Physical Medicine and Rehabilitation

## 2018-09-09 ENCOUNTER — Ambulatory Visit (INDEPENDENT_AMBULATORY_CARE_PROVIDER_SITE_OTHER): Payer: BC Managed Care – PPO | Admitting: Physical Medicine and Rehabilitation

## 2018-09-09 ENCOUNTER — Other Ambulatory Visit: Payer: Self-pay

## 2018-09-09 ENCOUNTER — Ambulatory Visit: Payer: Self-pay

## 2018-09-09 DIAGNOSIS — M461 Sacroiliitis, not elsewhere classified: Secondary | ICD-10-CM

## 2018-09-09 DIAGNOSIS — M7061 Trochanteric bursitis, right hip: Secondary | ICD-10-CM

## 2018-09-09 NOTE — Progress Notes (Signed)
  Numeric Pain Rating Scale and Functional Assessment Average Pain 5   In the last MONTH (on 0-10 scale) has pain interfered with the following?  1. General activity like being  able to carry out your everyday physical activities such as walking, climbing stairs, carrying groceries, or moving a chair?  Rating(8)   +Driver, -BT, -Dye Allergies.  

## 2018-09-15 DIAGNOSIS — M7061 Trochanteric bursitis, right hip: Secondary | ICD-10-CM | POA: Diagnosis not present

## 2018-09-15 DIAGNOSIS — M461 Sacroiliitis, not elsewhere classified: Secondary | ICD-10-CM

## 2018-09-15 MED ORDER — BUPIVACAINE HCL 0.25 % IJ SOLN
4.0000 mL | INTRAMUSCULAR | Status: AC | PRN
  Administered 2018-09-15: 22:00:00 4 mL via INTRA_ARTICULAR

## 2018-09-15 MED ORDER — TRIAMCINOLONE ACETONIDE 40 MG/ML IJ SUSP
80.0000 mg | INTRAMUSCULAR | Status: AC | PRN
  Administered 2018-09-15: 22:00:00 80 mg via INTRA_ARTICULAR

## 2018-09-15 MED ORDER — METHYLPREDNISOLONE ACETATE 80 MG/ML IJ SUSP
80.0000 mg | INTRAMUSCULAR | Status: AC | PRN
  Administered 2018-09-15: 22:00:00 80 mg via INTRA_ARTICULAR

## 2018-09-15 MED ORDER — LIDOCAINE HCL 2 % IJ SOLN
4.0000 mL | INTRAMUSCULAR | Status: AC | PRN
  Administered 2018-09-15: 4 mL

## 2018-09-15 MED ORDER — BUPIVACAINE HCL 0.5 % IJ SOLN
2.0000 mL | INTRAMUSCULAR | Status: AC | PRN
  Administered 2018-09-15: 22:00:00 2 mL via INTRA_ARTICULAR

## 2018-09-15 NOTE — Progress Notes (Signed)
[name] - 53 y.o. female MRN [id number]  Date of birth: 1965/10/13  Office Visit Note: Visit Date: 09/09/2018 PCP: Leighton Ruff, MD Referred by: Leighton Ruff, MD  Subjective: Chief Complaint  Patient presents with  . Lower Back - Pain  . Right Hip - Pain   HPI:  [name] is a 53 y.o. female who comes in today At the request of Dr. Basil Dess for diagnostic and hopefully therapeutic right sacroiliac joint injection as well as right greater trochanteric injection under fluoroscopic guidance.  Patient had prior sacroiliac joint injection in March the gave her quite a bit of relief up until just recently.  This is been about the only thing that is helped her.  We saw her remotely many years ago for a sacroiliac joint injection that did seem to help.  Please review prior notes and Dr. Otho Ket notes for details and justification.  They did suggest if she gets temporary relief or no relief to look at pelvic MRI.  Consideration could be given to radiofrequency ablation of the lateral branches of the sacroiliac joint but it is somewhat difficult to get those approved.  ROS Otherwise per HPI.  Assessment & Plan: Visit Diagnoses:  1. Sacroiliitis ([place])   2. Greater trochanteric bursitis, right     Plan: No additional findings.   Meds & Orders: No orders of the defined types were placed in this encounter.   Orders Placed This Encounter  Procedures  . XR C-ARM NO REPORT    Follow-up: No follow-ups on file.   Procedures: Sacroiliac Joint Inj on 09/15/2018 9:44 PM Indications: pain and diagnostic evaluation Details: 22 G 3.5 in needle, fluoroscopy-guided posterior approach Medications: 2 mL bupivacaine 0.5 %; 80 mg methylPREDNISolone acetate 80 MG/ML Outcome: tolerated well, no immediate complications  There was excellent flow of contrast producing a partial arthrogram of the sacroiliac joint.  Procedure, treatment alternatives, risks and benefits explained, specific risks  discussed. Consent was given by the patient. Immediately prior to procedure a time out was called to verify the correct patient, procedure, equipment, support staff and site/side marked as required. Patient was prepped and draped in the usual sterile fashion.   Large Joint Inj: R greater trochanter on 09/15/2018 9:44 PM Indications: pain and diagnostic evaluation Details: 22 G 3.5 in needle, fluoroscopy-guided lateral approach  Arthrogram: No  Medications: 4 mL lidocaine 2 %; 80 mg triamcinolone acetonide 40 MG/ML; 4 mL bupivacaine 0.25 % Outcome: tolerated well, no immediate complications  There was excellent flow of contrast outlined the greater trochanteric bursa without vascular uptake. Procedure, treatment alternatives, risks and benefits explained, specific risks discussed. Consent was given by the patient. Immediately prior to procedure a time out was called to verify the correct patient, procedure, equipment, support staff and site/side marked as required. Patient was prepped and draped in the usual sterile fashion.      No notes on file   Clinical History: MRI LUMBAR SPINE WITHOUT CONTRAST  TECHNIQUE: Multiplanar, multisequence MR imaging of the lumbar spine was performed. No intravenous contrast was administered.  COMPARISON:  Radiography 04/19/2018.  MRI 02/03/2010.  FINDINGS: Segmentation:  5 lumbar type vertebral bodies.  Alignment:  Normal  Vertebrae:  Normal  Conus medullaris and cauda equina: Conus extends to the L2 level. Conus and cauda equina appear normal.  Paraspinal and other soft tissues: Negative  Disc levels:  No abnormality at T12-L1, L1-2 or L2-3.  L3-4: Mild desiccation and bulging of the disc. Mild facet and  ligamentous hypertrophy. No compressive stenosis.  L4-5 and L5-S1: Normal.  No disc or facet pathology.  No stenosis.  Compared to the study of 2011, the findings are quite similar. There is not been any significant  progression of degenerative change at L3-4.  IMPRESSION: Very similar appearance to the study of 2011. Mild degenerative change at L3-4 with mild desiccation and bulging of the disc. Mild facet hypertrophy. No apparent compressive stenosis. The other levels are normal.   Electronically Signed   By: Nelson Chimes M.D.   On: 04/29/2018 16:33     Objective:  VS:  HT:    WT:   BMI:     BP:   HR: bpm  TEMP: ( )  RESP:  Physical Exam Musculoskeletal:     Comments: Patient ambulates without aid has a positive Fortin finger sign over the right sacroiliac joint.  She has positive Saralyn Pilar sign on the right as well.  She has tenderness over the right greater trochanter not the left.  Some tenderness across the entire lower back with light palpation.     Ortho Exam Imaging: No results found.

## 2018-09-27 ENCOUNTER — Encounter: Payer: Self-pay | Admitting: Specialist

## 2018-09-27 ENCOUNTER — Ambulatory Visit (INDEPENDENT_AMBULATORY_CARE_PROVIDER_SITE_OTHER): Payer: BC Managed Care – PPO | Admitting: Specialist

## 2018-09-27 ENCOUNTER — Other Ambulatory Visit: Payer: Self-pay

## 2018-09-27 VITALS — BP 133/90 | HR 96 | Ht 63.0 in | Wt 116.0 lb

## 2018-09-27 DIAGNOSIS — M461 Sacroiliitis, not elsewhere classified: Secondary | ICD-10-CM

## 2018-09-27 DIAGNOSIS — M7061 Trochanteric bursitis, right hip: Secondary | ICD-10-CM | POA: Diagnosis not present

## 2018-09-27 NOTE — Progress Notes (Signed)
Office Visit Note   Patient: [name]           Date of Birth: [birth year]           MRN: [id number] Visit Date: 09/27/2018              Requested by: Leighton Ruff, MD [place],  [place] [id number] PCP: Leighton Ruff, MD   Assessment & Plan: Visit Diagnoses:  1. Sacroiliitis, not elsewhere classified ([place])   2. Greater trochanteric bursitis of right hip     Plan: Avoid frequent bending and stooping  No lifting greater than 10 lbs. May use ice or moist heat for pain. Weight loss is of benefit. Best medication for lumbar disc disease is arthritis medications like motrin, celebrex and naprosyn. Exercise is important to improve your indurance and does allow people to function better inspite of back pain. Call Christy my assistant if the pain recurrs and we would scheduled for further SI joint and trochanteric injection.   Follow-Up Instructions: No follow-ups on file.   Orders:  No orders of the defined types were placed in this encounter.  No orders of the defined types were placed in this encounter.     Procedures: No procedures performed   Clinical Data: No additional findings.   Subjective: Chief Complaint  Patient presents with  . Lower Back - Follow-up    Had Right Sacroiliac Joint injection an Right Greater troch injection and these have done great for her, states that she has been able to do more than she has in along time.  She is having more muscle pain, this is expected due to the things she's doing.    HPI  Review of Systems  Constitutional: Negative.   HENT: Positive for congestion and sinus pressure.   Eyes: Positive for pain.  Respiratory: Negative.   Cardiovascular: Negative.   Gastrointestinal: Negative.   Endocrine: Negative.   Genitourinary: Negative.   Musculoskeletal: Negative.   Skin: Negative.   Allergic/Immunologic: Negative.   Neurological: Negative.   Hematological: Negative.   Psychiatric/Behavioral:  Negative.      Objective: Vital Signs: BP 133/90 (BP Location: Left Arm, Patient Position: Sitting)   Pulse 96   Ht 5\' 3"  (1.6 m)   Wt 116 lb (52.6 kg)   BMI 20.55 kg/m   Physical Exam Constitutional:      Appearance: She is well-developed.  HENT:     Head: Normocephalic and atraumatic.  Eyes:     Pupils: Pupils are equal, round, and reactive to light.  Neck:     Musculoskeletal: Normal range of motion and neck supple.  Pulmonary:     Effort: Pulmonary effort is normal.     Breath sounds: Normal breath sounds.  Abdominal:     General: Bowel sounds are normal.     Palpations: Abdomen is soft.  Skin:    General: Skin is warm and dry.  Neurological:     Mental Status: She is alert and oriented to person, place, and time.  Psychiatric:        Behavior: Behavior normal.        Thought Content: Thought content normal.        Judgment: Judgment normal.     Back Exam   Tenderness  The patient is experiencing tenderness in the lumbar.  Range of Motion  Extension: normal  Flexion:  70 abnormal  Lateral bend right: abnormal  Lateral bend left: abnormal  Rotation right: abnormal  Rotation  left: abnormal   Muscle Strength  Right Quadriceps:  5/5  Left Quadriceps:  5/5  Right Hamstrings:  5/5  Left Hamstrings:  5/5   Tests  Straight leg raise right: negative Straight leg raise left: negative  Reflexes  Patellar: 2/4 Achilles: 2/4 Biceps: 2/4 Babinski's sign: normal   Other  Toe walk: normal Heel walk: normal Sensation: normal Gait: normal  Erythema: no back redness Scars: absent      Specialty Comments:  No specialty comments available.  Imaging: No results found.   PMFS History: Patient Active Problem List   Diagnosis Date Noted  . Pelvic relaxation 08/01/2018  . Prolapse of female pelvic organs 08/01/2018  . Other fatigue 10/01/2016  . Primary insomnia 10/01/2016  . History of migraine 10/01/2016  . DDD (degenerative disc disease),  lumbar 04/08/2016  . DDD (degenerative disc disease), cervical 04/08/2016  . Osteoarthritis, hand 04/08/2016  . Fibromyalgia   . Intervertebral disk disease   . HA (headache)    Past Medical History:  Diagnosis Date  . Esophageal spasm 2019  . Fibromyalgia   . Intervertebral disk disease   . Migraine     Family History  Problem Relation Age of Onset  . Breast cancer Neg Hx     Past Surgical History:  Procedure Laterality Date  . ANTERIOR AND POSTERIOR REPAIR WITH SACROSPINOUS FIXATION N/A 08/01/2018   Procedure: ANTERIOR REPAIR;  Surgeon: Molli Posey, MD;  Location[facility];  Service: Gynecology;  Laterality: N/A;  . APPENDECTOMY    . CERVICAL FUSION    . CESAREAN SECTION    . HEMORRHOID SURGERY    . LAPAROSCOPIC VAGINAL HYSTERECTOMY WITH SALPINGECTOMY Bilateral 08/01/2018   Procedure: LAPAROSCOPIC ASSISTED VAGINAL HYSTERECTOMY WITH SALPINGECTOMY;  Surgeon: Molli Posey, MD;  Location[facility];  Service: Gynecology;  Laterality: Bilateral;  need bed  . UTERINE POLYPS  2019   Social History   Occupational History  . Not on file  Tobacco Use  . Smoking status: Former Smoker    Years: 10.00    Types: E-cigarettes  . Smokeless tobacco: Never Used  Substance and Sexual Activity  . Alcohol use: Not Currently  . Drug use: No  . Sexual activity: Not on file

## 2018-09-27 NOTE — Patient Instructions (Signed)
Plan: Avoid frequent bending and stooping  No lifting greater than 10 lbs. May use ice or moist heat for pain. Weight loss is of benefit. Best medication for lumbar disc disease is arthritis medications like motrin, celebrex and naprosyn. Exercise is important to improve your indurance and does allow people to function better inspite of back pain. Call Christy my assistant if the pain recurrs and we would scheduled for further SI joint and trochanteric injection.

## 2018-09-30 ENCOUNTER — Ambulatory Visit: Payer: BC Managed Care – PPO | Admitting: Specialist

## 2018-10-05 NOTE — Progress Notes (Signed)
Office Visit Note  Patient: [name]             Date of Birth: 1965-04-06           MRN: [id number]             PCP: Leighton Ruff, MD Referring: Leighton Ruff, MD Visit Date: 10/18/2018 Occupation: @GUAROCC @  Subjective:  Right trochanteric bursitis   History of Present Illness: [name] is a 53 y.o. female with history of fibromyalgia, DDD, and osteoarthritis.  She is taking Robaxin 500 mg BID and Zanaflex 4 mg po at bedtime for muscle spasms.  She takes tramadol 50 mg 1 to 2 tablets BID for pain relief.  She continues to take Topamax 25 mg 2 tablets at bedtime.  She reports she had a fibromyalgia flare after her hysterectomy in May 2020.  She continues to have generalized muscle aches and muscle tenderness. She reports she has cut back to 3.5 tablets of tramadol daily and she is going to try to eliminate Zanaflex at bedtime. She reports she had a right trochanteric bursa and right SI joint injection on 09/09/18, which provided significant relief for 3 weeks.  She states she was pain free for 3 weeks but the pain has started to return.  She rates her pain a 3/10. She plans on following up with Dr. Ernestina Patches. She states she continues to have interrupted sleep at night.  She has chronic fatigue.      Activities of Daily Living:  Patient reports morning stiffness for 1-2 hours.   Patient Reports nocturnal pain.  Difficulty dressing/grooming: Denies Difficulty climbing stairs: Denies Difficulty getting out of chair: Reports Difficulty using hands for taps, buttons, cutlery, and/or writing: Reports  Review of Systems  Constitutional: Positive for fatigue.  HENT: Positive for mouth dryness. Negative for mouth sores and nose dryness.   Eyes: Negative for pain, visual disturbance and dryness.  Respiratory: Negative for cough, hemoptysis, shortness of breath and difficulty breathing.   Cardiovascular: Negative for chest pain, palpitations, hypertension and swelling in legs/feet.   Gastrointestinal: Negative for blood in stool, constipation and diarrhea.  Endocrine: Negative for increased urination.  Genitourinary: Negative for painful urination.  Musculoskeletal: Positive for myalgias, morning stiffness, muscle tenderness and myalgias. Negative for arthralgias, joint pain, joint swelling and muscle weakness.  Skin: Negative for color change, pallor, rash, hair loss, nodules/bumps, skin tightness, ulcers and sensitivity to sunlight.  Allergic/Immunologic: Negative for susceptible to infections.  Neurological: Negative for dizziness, numbness, headaches and weakness.  Hematological: Negative for swollen glands.  Psychiatric/Behavioral: Negative for depressed mood and sleep disturbance. The patient is not nervous/anxious.     PMFS History:  Patient Active Problem List   Diagnosis Date Noted  . Pelvic relaxation 08/01/2018  . Prolapse of female pelvic organs 08/01/2018  . Other fatigue 10/01/2016  . Primary insomnia 10/01/2016  . History of migraine 10/01/2016  . DDD (degenerative disc disease), lumbar 04/08/2016  . DDD (degenerative disc disease), cervical 04/08/2016  . Osteoarthritis, hand 04/08/2016  . Fibromyalgia   . Intervertebral disk disease   . HA (headache)     Past Medical History:  Diagnosis Date  . Esophageal spasm 2019  . Fibromyalgia   . Intervertebral disk disease   . Migraine     Family History  Problem Relation Age of Onset  . Breast cancer Neg Hx    Past Surgical History:  Procedure Laterality Date  . ANTERIOR AND POSTERIOR REPAIR WITH SACROSPINOUS FIXATION N/A 08/01/2018  Procedure: ANTERIOR REPAIR;  Surgeon: Molli Posey, MD;  Location: [facility];  Service: Gynecology;  Laterality: N/A;  . APPENDECTOMY    . CERVICAL FUSION    . CESAREAN SECTION    . HEMORRHOID SURGERY    . LAPAROSCOPIC VAGINAL HYSTERECTOMY WITH SALPINGECTOMY Bilateral 08/01/2018   Procedure: LAPAROSCOPIC ASSISTED VAGINAL HYSTERECTOMY WITH  SALPINGECTOMY;  Surgeon: Molli Posey, MD;  Location: [facility];  Service: Gynecology;  Laterality: Bilateral;  need bed  . UTERINE POLYPS  2019   Social History   Social History Narrative   Patient lives at home with her husband and son. She has her BS and she is not working.     There is no immunization history on file for this patient.   Objective: Vital Signs: BP 104/69 (BP Location: Right Arm, Patient Position: Sitting, Cuff Size: Small)   Pulse (!) 104   Resp 12   Ht 5\' 3"  (1.6 m)   Wt 115 lb 9.6 oz (52.4 kg)   LMP 01/15/2018   BMI 20.48 kg/m    Physical Exam Vitals signs and nursing note reviewed.  Constitutional:      Appearance: She is well-developed.  HENT:     Head: Normocephalic and atraumatic.  Eyes:     Conjunctiva/sclera: Conjunctivae normal.  Neck:     Musculoskeletal: Normal range of motion.  Cardiovascular:     Rate and Rhythm: Normal rate and regular rhythm.     Heart sounds: Normal heart sounds.  Pulmonary:     Effort: Pulmonary effort is normal.     Breath sounds: Normal breath sounds.  Abdominal:     General: Bowel sounds are normal.     Palpations: Abdomen is soft.  Lymphadenopathy:     Cervical: No cervical adenopathy.  Skin:    General: Skin is warm and dry.     Capillary Refill: Capillary refill takes less than 2 seconds.  Neurological:     Mental Status: She is alert and oriented to person, place, and time.  Psychiatric:        Behavior: Behavior normal.      Musculoskeletal Exam: Generalized hyperalgesia and positive tender points. C-spine, thoracic spine, and lumbar spine good ROM.  Right SI joint tenderness. Shoulder joints, elbow joints, wrist joints, MCPs, PIPs, and DIPs good ROM with no synovitis.  CMC joint synovial thickening.  Hip joints, knee joints, ankle joints, MTPs, PIPs, and DIPs good ROM with no synovitis.  No warmth or effusion of knee joints.  No tenderness or swelling of ankle joints.  Tenderness  over the right trochanteric bursa.   CDAI Exam: CDAI Score: - Patient Global: -; Provider Global: - Swollen: -; Tender: - Joint Exam   No joint exam has been documented for this visit   There is currently no information documented on the homunculus. Go to the Rheumatology activity and complete the homunculus joint exam.  Investigation: No additional findings.  Imaging: No results found.  Recent Labs: Lab Results  Component Value Date   WBC 11.4 (H) 08/02/2018   HGB 10.3 (L) 08/02/2018   PLT 233 08/02/2018    Speciality Comments: No specialty comments available.  Procedures:  No procedures performed Allergies: Lyrica [pregabalin]   Assessment / Plan:     Visit Diagnoses: Fibromyalgia -She has generalized hyperalgesia and positive tender points on exam.  She had a severe fibromyalgia flare in May 2020 status post her hysterectomy.  She continues to have generalized muscle aches and muscle tenderness.  She had a right SI joint and right trochanteric bursa cortisone injection performed by Dr. Ernestina Patches on 09/09/2018 which provided significant relief for 3 weeks.  She was pain-free for 3 weeks but her pain is started to return.  Her pain was a 3 out of 10 today.  She plans on following up with Dr. Ernestina Patches in the future.  She continues to take Robaxin 500 mg twice daily and Zanaflex 4 mg by mouth at bedtime.  She is planning on discontinuing Zanaflex per recommendations of Dr. Louanne Skye.  She has been taking tramadol 50 mg 1 to 2 tablets twice daily.  She plans on reducing to 3.5 tablets by mouth daily and plans on reducing further to 3 tablets by mouth daily as needed.  She continues to take Topamax 25 mg 2 tablets by mouth at bedtime.  She was encouraged to stay active and exercise on a regular basis.  She has chronic fatigue related to insomnia.  We discussed the importance of good sleep hygiene.  She will follow-up in the office in 6 months.  Pain management - Tramadol 50 mg 1 to 2 tablets  twice daily.  She has reduced her tramadol to 3 and half tablets by mouth daily.  She has been trying to reduce to 3 tablets by mouth daily as needed for pain relief.  UDS: 4/28/2020Narc agreement: 07/08/2018.   Primary osteoarthritis of both hands -She has CMC joint synovial thickening bilaterally.  No tenderness or synovitis.  She has complete fist formation bilaterally.  Joint protection and muscle strengthening were discussed.  DDD (degenerative disc disease), cervical - She had surgery in the past from her cervical spine by Dr. Marcial Pacas.  She has stiffness in her C-spine.  She has no symptoms of radiculopathy.  DDD (degenerative disc disease), lumbar -She has chronic lower back pain and stiffness.  She experiences nocturnal pain at times.  She has no symptoms radiculopathy.  Primary insomnia - She has interrupted sleep at night.  She experiences discomfort in her lower back and right trochanter bursa which causes nocturnal pain.  Good sleep hygiene was discussed.  She has been taking Zanaflex 4 mg by mouth at bedtime but is planning to discontinue this per recommendation of Dr. Louanne Skye.  Other fatigue - She she has chronic fatigue related to insomnia.  Discussed importance of regular exercise.  History of migraine - Controlled on Topamax 25 mg 2 tablets by mouth at bedtime.  Orders: No orders of the defined types were placed in this encounter.  No orders of the defined types were placed in this encounter.   Face-to-face time spent with patient was 30 minutes. Greater than 50% of time was spent in counseling and coordination of care.  Follow-Up Instructions: Return in about 6 months (around 04/20/2019) for Fibromyalgia, Osteoarthritis.   Ofilia Neas, PA-C   I examined and evaluated the patient with Hazel Sams PA.  Patient continues to have some discomfort due to fibromyalgia and osteoarthritis.  On my examination there was no joint swelling noted.  The plan of care was discussed as noted  above.  Bo Merino, MD  Note - This record has been created using Editor, commissioning.  Chart creation errors have been sought, but may not always  have been located. Such creation errors do not reflect on  the standard of medical care.

## 2018-10-06 ENCOUNTER — Encounter (HOSPITAL_BASED_OUTPATIENT_CLINIC_OR_DEPARTMENT_OTHER): Payer: Self-pay | Admitting: Obstetrics and Gynecology

## 2018-10-07 ENCOUNTER — Other Ambulatory Visit: Payer: Self-pay | Admitting: Physician Assistant

## 2018-10-07 NOTE — Telephone Encounter (Signed)
Last Visit: 04/19/18 Next Visit: 10/18/18 UDS: 07/12/18 Narc Agreement: 07/12/18  Okay to refill Tramadol?

## 2018-10-09 ENCOUNTER — Other Ambulatory Visit: Payer: Self-pay | Admitting: Rheumatology

## 2018-10-10 NOTE — Telephone Encounter (Signed)
Last Visit: 04/19/18 Next Visit: 10/18/18  Okay to refill per Dr. Estanislado Pandy

## 2018-10-18 ENCOUNTER — Encounter: Payer: Self-pay | Admitting: Rheumatology

## 2018-10-18 ENCOUNTER — Other Ambulatory Visit: Payer: Self-pay

## 2018-10-18 ENCOUNTER — Ambulatory Visit (INDEPENDENT_AMBULATORY_CARE_PROVIDER_SITE_OTHER): Payer: BC Managed Care – PPO | Admitting: Rheumatology

## 2018-10-18 VITALS — BP 104/69 | HR 104 | Resp 12 | Ht 63.0 in | Wt 115.6 lb

## 2018-10-18 DIAGNOSIS — M797 Fibromyalgia: Secondary | ICD-10-CM

## 2018-10-18 DIAGNOSIS — M5030 Other cervical disc degeneration, unspecified cervical region: Secondary | ICD-10-CM | POA: Diagnosis not present

## 2018-10-18 DIAGNOSIS — R5383 Other fatigue: Secondary | ICD-10-CM

## 2018-10-18 DIAGNOSIS — F5101 Primary insomnia: Secondary | ICD-10-CM

## 2018-10-18 DIAGNOSIS — M19042 Primary osteoarthritis, left hand: Secondary | ICD-10-CM

## 2018-10-18 DIAGNOSIS — R52 Pain, unspecified: Secondary | ICD-10-CM | POA: Diagnosis not present

## 2018-10-18 DIAGNOSIS — M19041 Primary osteoarthritis, right hand: Secondary | ICD-10-CM

## 2018-10-18 DIAGNOSIS — M5136 Other intervertebral disc degeneration, lumbar region: Secondary | ICD-10-CM

## 2018-10-18 DIAGNOSIS — Z8669 Personal history of other diseases of the nervous system and sense organs: Secondary | ICD-10-CM

## 2018-10-20 ENCOUNTER — Other Ambulatory Visit: Payer: Self-pay | Admitting: Rheumatology

## 2018-10-20 NOTE — Telephone Encounter (Signed)
Last Visit: 10/18/18 Next Visit: 04/20/19  Okay to refill per Dr. Estanislado Pandy

## 2018-10-26 DIAGNOSIS — J0111 Acute recurrent frontal sinusitis: Secondary | ICD-10-CM | POA: Diagnosis not present

## 2018-10-26 DIAGNOSIS — J309 Allergic rhinitis, unspecified: Secondary | ICD-10-CM | POA: Diagnosis not present

## 2018-11-14 ENCOUNTER — Other Ambulatory Visit: Payer: Self-pay | Admitting: Physician Assistant

## 2018-11-14 NOTE — Telephone Encounter (Signed)
Last Visit: 10/18/18 Next Visit: 04/20/19 UDS:07/12/18 Narc Agreement: 07/12/18  Okay to refill tramadol?

## 2018-11-16 ENCOUNTER — Telehealth: Payer: Self-pay | Admitting: Physical Medicine and Rehabilitation

## 2018-11-16 NOTE — Telephone Encounter (Signed)
ok 

## 2018-11-17 NOTE — Telephone Encounter (Signed)
Scheduled for 9/28 at 1030.

## 2018-11-18 DIAGNOSIS — H524 Presbyopia: Secondary | ICD-10-CM | POA: Diagnosis not present

## 2018-11-22 DIAGNOSIS — R42 Dizziness and giddiness: Secondary | ICD-10-CM | POA: Diagnosis not present

## 2018-11-22 DIAGNOSIS — J310 Chronic rhinitis: Secondary | ICD-10-CM | POA: Diagnosis not present

## 2018-11-22 DIAGNOSIS — J342 Deviated nasal septum: Secondary | ICD-10-CM | POA: Diagnosis not present

## 2018-11-22 DIAGNOSIS — J343 Hypertrophy of nasal turbinates: Secondary | ICD-10-CM | POA: Diagnosis not present

## 2018-12-06 DIAGNOSIS — H9041 Sensorineural hearing loss, unilateral, right ear, with unrestricted hearing on the contralateral side: Secondary | ICD-10-CM | POA: Diagnosis not present

## 2018-12-06 DIAGNOSIS — R42 Dizziness and giddiness: Secondary | ICD-10-CM | POA: Diagnosis not present

## 2018-12-12 ENCOUNTER — Encounter: Payer: Self-pay | Admitting: Physical Medicine and Rehabilitation

## 2018-12-12 ENCOUNTER — Ambulatory Visit (INDEPENDENT_AMBULATORY_CARE_PROVIDER_SITE_OTHER): Payer: BC Managed Care – PPO | Admitting: Physical Medicine and Rehabilitation

## 2018-12-12 ENCOUNTER — Ambulatory Visit: Payer: Self-pay

## 2018-12-12 DIAGNOSIS — M7061 Trochanteric bursitis, right hip: Secondary | ICD-10-CM

## 2018-12-12 DIAGNOSIS — M461 Sacroiliitis, not elsewhere classified: Secondary | ICD-10-CM

## 2018-12-12 NOTE — Progress Notes (Signed)
QUINNLYNN PEEK - 53 y.o. female MRN [id number]  Date of birth: 1965/12/22  Office Visit Note: Visit Date: 12/12/2018 PCP: Leighton Ruff, MD Referred by: Leighton Ruff, MD  Subjective: Chief Complaint  Patient presents with  . Lower Back - Pain  . Right Leg - Pain   HPI:  MARCEDES SCHEMMEL is a 53 y.o. female who comes in today For planned repeat right sacroiliac joint injection with fluoroscopic guidance along with greater trochanteric bursa injection on the right with fluoroscopic guidance.  She is followed mainly by Dr. Basil Dess as well as Dr. Bo Merino.  She unfortunately has a constellation of fibromyalgia chronic headache chronic insomnia and really all over body pain but in particular right hip and leg pain.  Prior injection performed gave her tremendous amount of relief for 2 or 3 weeks and then the symptoms started to return.  It really did vastly make a difference for her just not lasting long enough.  She asked me today if an injection done to repeat this would cure her.  I tried to be nice but I do not think there is really a cure here and I think unfortunately she just has a lot of issues ongoing at the same time.  She probably does have some level of sacroiliac joint dysfunction or hypermobility that could be addressed with physical therapy and strengthening which she is really already done.  Obviously Dr. Louanne Skye could look at potential for sacroiliac joint fusion but that seems like a big undertaking.  ROS Otherwise per HPI.  Assessment & Plan: Visit Diagnoses:  1. Sacroiliitis ([place])   2. Greater trochanteric bursitis, right     Plan: No additional findings.   Meds & Orders: No orders of the defined types were placed in this encounter.   Orders Placed This Encounter  Procedures  . Sacroiliac Joint Inj  . Large Joint Inj: R greater trochanter  . XR C-ARM NO REPORT    Follow-up: No follow-ups on file.   Procedures: Sacroiliac Joint Inj on 12/12/2018 10:57 AM  Indications: pain and diagnostic evaluation Details: 22 G 3.5 in needle, fluoroscopy-guided posterior approach Medications: 2 mL bupivacaine 0.5 %; 80 mg methylPREDNISolone acetate 80 MG/ML Outcome: tolerated well, no immediate complications  There was excellent flow of contrast producing a partial arthrogram of the sacroiliac joint.  Procedure, treatment alternatives, risks and benefits explained, specific risks discussed. Consent was given by the patient. Immediately prior to procedure a time out was called to verify the correct patient, procedure, equipment, support staff and site/side marked as required. Patient was prepped and draped in the usual sterile fashion.   Large Joint Inj: R greater trochanter on 12/12/2018 10:58 AM Indications: pain and diagnostic evaluation Details: 22 G 3.5 in needle, fluoroscopy-guided lateral approach  Arthrogram: No  Medications: 4 mL lidocaine 2 %; 4 mL bupivacaine 0.25 %; 40 mg triamcinolone acetonide 40 MG/ML Outcome: tolerated well, no immediate complications  There was excellent flow of contrast outlined the greater trochanteric bursa without vascular uptake. Procedure, treatment alternatives, risks and benefits explained, specific risks discussed. Consent was given by the patient. Immediately prior to procedure a time out was called to verify the correct patient, procedure, equipment, support staff and site/side marked as required. Patient was prepped and draped in the usual sterile fashion.      No notes on file   Clinical History: MRI LUMBAR SPINE WITHOUT CONTRAST  TECHNIQUE: Multiplanar, multisequence MR imaging of the lumbar spine was performed. No intravenous  contrast was administered.  COMPARISON:  Radiography 04/19/2018.  MRI 02/03/2010.  FINDINGS: Segmentation:  5 lumbar type vertebral bodies.  Alignment:  Normal  Vertebrae:  Normal  Conus medullaris and cauda equina: Conus extends to the L2 level. Conus and cauda  equina appear normal.  Paraspinal and other soft tissues: Negative  Disc levels:  No abnormality at T12-L1, L1-2 or L2-3.  L3-4: Mild desiccation and bulging of the disc. Mild facet and ligamentous hypertrophy. No compressive stenosis.  L4-5 and L5-S1: Normal.  No disc or facet pathology.  No stenosis.  Compared to the study of 2011, the findings are quite similar. There is not been any significant progression of degenerative change at L3-4.  IMPRESSION: Very similar appearance to the study of 2011. Mild degenerative change at L3-4 with mild desiccation and bulging of the disc. Mild facet hypertrophy. No apparent compressive stenosis. The other levels are normal.   Electronically Signed   By: Nelson Chimes M.D.   On: 04/29/2018 16:33     Objective:  VS:  HT:    WT:   BMI:     BP:   HR: bpm  TEMP: ( )  RESP:  Physical Exam  Ortho Exam Imaging: Xr C-arm No Report  Result Date: 12/12/2018 Please see Notes tab for imaging impression.

## 2018-12-12 NOTE — Progress Notes (Signed)
.  Numeric Pain Rating Scale and Functional Assessment Average Pain 7   In the last MONTH (on 0-10 scale) has pain interfered with the following?  1. General activity like being  able to carry out your everyday physical activities such as walking, climbing stairs, carrying groceries, or moving a chair?  Rating(8)    -Dye Allergies.

## 2018-12-13 MED ORDER — BUPIVACAINE HCL 0.5 % IJ SOLN
2.0000 mL | INTRAMUSCULAR | Status: AC | PRN
  Administered 2018-12-12: 2 mL via INTRA_ARTICULAR

## 2018-12-13 MED ORDER — BUPIVACAINE HCL 0.25 % IJ SOLN
4.0000 mL | INTRAMUSCULAR | Status: AC | PRN
  Administered 2018-12-12: 4 mL via INTRA_ARTICULAR

## 2018-12-13 MED ORDER — METHYLPREDNISOLONE ACETATE 80 MG/ML IJ SUSP
80.0000 mg | INTRAMUSCULAR | Status: AC | PRN
  Administered 2018-12-12: 80 mg via INTRA_ARTICULAR

## 2018-12-13 MED ORDER — LIDOCAINE HCL 2 % IJ SOLN
4.0000 mL | INTRAMUSCULAR | Status: AC | PRN
  Administered 2018-12-12: 4 mL

## 2018-12-13 MED ORDER — TRIAMCINOLONE ACETONIDE 40 MG/ML IJ SUSP
40.0000 mg | INTRAMUSCULAR | Status: AC | PRN
  Administered 2018-12-12: 40 mg via INTRA_ARTICULAR

## 2018-12-14 DIAGNOSIS — R42 Dizziness and giddiness: Secondary | ICD-10-CM | POA: Diagnosis not present

## 2018-12-15 DIAGNOSIS — Z862 Personal history of diseases of the blood and blood-forming organs and certain disorders involving the immune mechanism: Secondary | ICD-10-CM | POA: Diagnosis not present

## 2018-12-15 DIAGNOSIS — Z23 Encounter for immunization: Secondary | ICD-10-CM | POA: Diagnosis not present

## 2018-12-15 DIAGNOSIS — E7800 Pure hypercholesterolemia, unspecified: Secondary | ICD-10-CM | POA: Diagnosis not present

## 2018-12-15 DIAGNOSIS — R7301 Impaired fasting glucose: Secondary | ICD-10-CM | POA: Diagnosis not present

## 2018-12-20 DIAGNOSIS — J342 Deviated nasal septum: Secondary | ICD-10-CM | POA: Diagnosis not present

## 2018-12-20 DIAGNOSIS — R42 Dizziness and giddiness: Secondary | ICD-10-CM | POA: Diagnosis not present

## 2018-12-20 DIAGNOSIS — J310 Chronic rhinitis: Secondary | ICD-10-CM | POA: Diagnosis not present

## 2018-12-20 DIAGNOSIS — R0982 Postnasal drip: Secondary | ICD-10-CM | POA: Diagnosis not present

## 2018-12-20 DIAGNOSIS — J343 Hypertrophy of nasal turbinates: Secondary | ICD-10-CM | POA: Diagnosis not present

## 2018-12-21 ENCOUNTER — Other Ambulatory Visit: Payer: Self-pay | Admitting: Physician Assistant

## 2018-12-21 DIAGNOSIS — R42 Dizziness and giddiness: Secondary | ICD-10-CM | POA: Diagnosis not present

## 2018-12-21 DIAGNOSIS — R2689 Other abnormalities of gait and mobility: Secondary | ICD-10-CM | POA: Diagnosis not present

## 2018-12-21 NOTE — Telephone Encounter (Signed)
She is due to update UDS and narcotic agreement at the end of October.

## 2018-12-21 NOTE — Telephone Encounter (Signed)
Last Visit: 10/18/18 Next Visit: 04/20/19 UDS: 07/12/18 Next Visit: 07/12/18  Okay to refill Tramadol?

## 2018-12-22 NOTE — Telephone Encounter (Signed)
Attempted to contact the patient and left message for patient to call the office.  

## 2018-12-28 ENCOUNTER — Telehealth: Payer: Self-pay | Admitting: *Deleted

## 2018-12-28 DIAGNOSIS — R42 Dizziness and giddiness: Secondary | ICD-10-CM | POA: Diagnosis not present

## 2018-12-28 NOTE — Telephone Encounter (Signed)
Patient states she is having surgery January 19, 2019. Patient wanted to make [place] aware. Patient advised she is due to update her UDS and narcotic agreement at the end of this month. Patient verbalized agreement.

## 2019-01-03 ENCOUNTER — Other Ambulatory Visit: Payer: Self-pay | Admitting: *Deleted

## 2019-01-03 DIAGNOSIS — Z5181 Encounter for therapeutic drug level monitoring: Secondary | ICD-10-CM

## 2019-01-03 DIAGNOSIS — R52 Pain, unspecified: Secondary | ICD-10-CM

## 2019-01-04 DIAGNOSIS — R2689 Other abnormalities of gait and mobility: Secondary | ICD-10-CM | POA: Diagnosis not present

## 2019-01-04 DIAGNOSIS — R42 Dizziness and giddiness: Secondary | ICD-10-CM | POA: Diagnosis not present

## 2019-01-06 LAB — PAIN MGMT, TRAMADOL W/MEDMATCH, U
Desmethyltramadol: 374 ng/mL
Tramadol: 3396 ng/mL

## 2019-01-06 LAB — PAIN MGMT, PROFILE 5 W/CONF, U
Amphetamines: NEGATIVE ng/mL
Barbiturates: NEGATIVE ng/mL
Benzodiazepines: NEGATIVE ng/mL
Cocaine Metabolite: NEGATIVE ng/mL
Creatinine: 22.6 mg/dL
Marijuana Metabolite: NEGATIVE ng/mL
Methadone Metabolite: NEGATIVE ng/mL
Opiates: NEGATIVE ng/mL
Oxidant: NEGATIVE ug/mL
Oxycodone: NEGATIVE ng/mL
pH: 6.6 (ref 4.5–9.0)

## 2019-01-09 DIAGNOSIS — E878 Other disorders of electrolyte and fluid balance, not elsewhere classified: Secondary | ICD-10-CM | POA: Diagnosis not present

## 2019-01-13 DIAGNOSIS — Z1159 Encounter for screening for other viral diseases: Secondary | ICD-10-CM | POA: Diagnosis not present

## 2019-01-16 ENCOUNTER — Other Ambulatory Visit: Payer: Self-pay | Admitting: Rheumatology

## 2019-01-16 NOTE — Telephone Encounter (Signed)
Last Visit: 10/18/18 Next Visit: 04/20/19  Okay to refill per Dr. Deveshwar  

## 2019-01-19 DIAGNOSIS — J3489 Other specified disorders of nose and nasal sinuses: Secondary | ICD-10-CM | POA: Diagnosis not present

## 2019-01-19 DIAGNOSIS — J343 Hypertrophy of nasal turbinates: Secondary | ICD-10-CM | POA: Diagnosis not present

## 2019-01-19 DIAGNOSIS — J342 Deviated nasal septum: Secondary | ICD-10-CM | POA: Diagnosis not present

## 2019-01-31 ENCOUNTER — Other Ambulatory Visit: Payer: Self-pay | Admitting: Physician Assistant

## 2019-01-31 NOTE — Telephone Encounter (Signed)
Last Visit: 10/18/18 Next Visit: 04/20/19 UDS: 01/03/19 Narc Agreement: 01/03/19  Okay to refill Tramadol?

## 2019-02-23 DIAGNOSIS — N9089 Other specified noninflammatory disorders of vulva and perineum: Secondary | ICD-10-CM | POA: Diagnosis not present

## 2019-02-23 DIAGNOSIS — N941 Unspecified dyspareunia: Secondary | ICD-10-CM | POA: Diagnosis not present

## 2019-02-23 DIAGNOSIS — B009 Herpesviral infection, unspecified: Secondary | ICD-10-CM | POA: Diagnosis not present

## 2019-03-20 ENCOUNTER — Other Ambulatory Visit: Payer: Self-pay | Admitting: Physician Assistant

## 2019-03-20 NOTE — Telephone Encounter (Signed)
Last Visit: 10/18/18 Next Visit: 04/20/19 UDS: 01/03/19 Narc Agreement: 01/03/19  Okay to refill Tramadol?

## 2019-03-23 DIAGNOSIS — E782 Mixed hyperlipidemia: Secondary | ICD-10-CM | POA: Diagnosis not present

## 2019-03-23 DIAGNOSIS — R7303 Prediabetes: Secondary | ICD-10-CM | POA: Diagnosis not present

## 2019-03-23 DIAGNOSIS — Z5181 Encounter for therapeutic drug level monitoring: Secondary | ICD-10-CM | POA: Diagnosis not present

## 2019-04-06 DIAGNOSIS — E871 Hypo-osmolality and hyponatremia: Secondary | ICD-10-CM | POA: Diagnosis not present

## 2019-04-07 ENCOUNTER — Other Ambulatory Visit: Payer: Self-pay | Admitting: Rheumatology

## 2019-04-07 NOTE — Telephone Encounter (Signed)
Last Visit: 10/18/18 Next Visit: 04/20/19  Okay to refill per Dr. Estanislado Pandy

## 2019-04-14 NOTE — Progress Notes (Signed)
Virtual Visit via Telephone Note  I connected with [name] on 04/17/19 at 10:15 AM EST by telephone and verified that I am speaking with the correct person using two identifiers.  Location: Patient: Home Provider: Clinic  This service was conducted via virtual visit.   The patient was located at home. I was located in my office.  Consent was obtained prior to the virtual visit and is aware of possible charges through their insurance for this visit.  The patient is an established patient.  Dr. Estanislado Pandy, MD conducted the virtual visit and Hazel Sams, PA-C acted as scribe during the service.  Office staff helped with scheduling follow up visits after the service was conducted.   I discussed the limitations, risks, security and privacy concerns of performing an evaluation and management service by telephone and the availability of in person appointments. I also discussed with the patient that there may be a patient responsible charge related to this service. The patient expressed understanding and agreed to proceed.  CC: Generalized pain  History of Present Illness: Patient is a 54 year old female with a past medical history of fibromyalgia, osteoarthritis, and DDD. She has intermittent right trochanteric bursitis and right SI joint pain.  She had cortisone injections on 12/12/18.  She has been performing stretching and strengthening exercise. She takes tramadol 50 mg 1.5 tablets during the day and 1 tablet at bedtime. She continues to try to reduce tramadol dose. She continues to take topamax 25 mg 2 tablets at bedtime, which manages her migraines and helps with insomnia.Marland Kitchen  She was able to discontinue Robaxin or zanaflex since her last visit.   Review of Systems  Constitutional: Negative for fever and malaise/fatigue.  HENT:       +Dry mouth  Eyes: Negative for photophobia, pain, discharge and redness.       +Dry eyes  Respiratory: Negative for cough, shortness of breath and wheezing.    Cardiovascular: Negative for chest pain and palpitations.  Gastrointestinal: Negative for blood in stool, constipation and diarrhea.  Genitourinary: Negative for dysuria.  Musculoskeletal: Positive for back pain, joint pain and myalgias. Negative for neck pain.  Skin: Negative for rash.  Neurological: Negative for dizziness and headaches.  Psychiatric/Behavioral: Negative for depression. The patient is not nervous/anxious and does not have insomnia.       Observations/Objective: Physical Exam  Constitutional: She is oriented to person, place, and time.  Neurological: She is alert and oriented to person, place, and time.  Psychiatric: Mood, memory, affect and judgment normal.   Patient reports morning stiffness for  1 hour.   Patient reports nocturnal pain.  Difficulty dressing/grooming: Denies Difficulty climbing stairs: Denies Difficulty getting out of chair: Denies Difficulty using hands for taps, buttons, cutlery, and/or writing: Reports   Assessment and Plan: Visit Diagnoses: Fibromyalgia: She has generalized muscle aches and muscle tenderness due to fibromyalgia.  She experiences generalized stiffness. She has been performing stretching and strengthening exercises daily.  She discontinued Robaxin and Zanaflex as recommended by Dr. Louanne Skye. She has been experiencing increased nocturnal pain since discontinuing robaxin last week.  She continues to take tramadol 50 mg 1 to 2 tablets twice daily as needed for pain relief.  She has chronic fatigue related to insomnia. We discussed the importance of regular exercise and good sleep hygiene.  She will follow up in 6 months.   Pain management - Tramadol 50 mg 1.5 tablets during the day and 1 tablet at bedtime for pain relief.  She is planning on trying to further reduce the dose of tramadol. UDS was consistent with treatment on 01/03/19.  Primary osteoarthritis of both hands -She has no joint pain or inflammation in both hands. Her morning  stiffness has improved.  She has occasional difficulty with writing due to the discomfort.  Joint protection and muscle strengthening were discussed.   DDD (degenerative disc disease), cervical - She had surgery in the past from her cervical spine by Dr. Marcial Pacas.  She has chronic neck pain and stiffness.   DDD (degenerative disc disease), lumbar -She is followed by Dr. Louanne Skye. She is no longer taking Zanaflex or robaxin.   Chronic right SI joint pain:  She had a right SI joint cortisone injection performed by Dr. Ernestina Patches on 12/12/18. She experiences intermittent discomfort and stiffness. She performs stretching exercises daily.   Trochanteric bursitis, right hip: She had a cortisone injection performed by Dr. Ernestina Patches on 12/12/18. She performs stretching exercises daily.  She went to physical therapy in the past, which was beneficial.   Primary insomnia - She takes topamax 25 mg 2 tablets at bedtime. The importance of good sleep hygiene was discussed.    Other fatigue - Chronic and related to insomnia. She was encouraged to stay active and exercise on a regular basis.   History of migraine - Controlled on Topamax 25 mg 2 tablets by mouth at bedtime. She would like to try to discontinue Topamax.  She was advised to reduce Topamax by half tablet every 2 weeks and to not discontinue abruptly.    Follow Up Instructions: She will follow up in 6 months.    I discussed the assessment and treatment plan with the patient. The patient was provided an opportunity to ask questions and all were answered. The patient agreed with the plan and demonstrated an understanding of the instructions.   The patient was advised to call back or seek an in-person evaluation if the symptoms worsen or if the condition fails to improve as anticipated.  I provided 25 minutes of non-face-to-face time during this encounter.   Bo Merino, MD   Scribed byLovena Le Dale,PA-C

## 2019-04-17 ENCOUNTER — Encounter: Payer: Self-pay | Admitting: Rheumatology

## 2019-04-17 ENCOUNTER — Other Ambulatory Visit: Payer: Self-pay

## 2019-04-17 ENCOUNTER — Telehealth (INDEPENDENT_AMBULATORY_CARE_PROVIDER_SITE_OTHER): Payer: BC Managed Care – PPO | Admitting: Rheumatology

## 2019-04-17 DIAGNOSIS — M797 Fibromyalgia: Secondary | ICD-10-CM

## 2019-04-17 DIAGNOSIS — M19041 Primary osteoarthritis, right hand: Secondary | ICD-10-CM

## 2019-04-17 DIAGNOSIS — M5441 Lumbago with sciatica, right side: Secondary | ICD-10-CM

## 2019-04-17 DIAGNOSIS — M5030 Other cervical disc degeneration, unspecified cervical region: Secondary | ICD-10-CM | POA: Diagnosis not present

## 2019-04-17 DIAGNOSIS — M533 Sacrococcygeal disorders, not elsewhere classified: Secondary | ICD-10-CM

## 2019-04-17 DIAGNOSIS — Z8669 Personal history of other diseases of the nervous system and sense organs: Secondary | ICD-10-CM

## 2019-04-17 DIAGNOSIS — M5442 Lumbago with sciatica, left side: Secondary | ICD-10-CM

## 2019-04-17 DIAGNOSIS — F5101 Primary insomnia: Secondary | ICD-10-CM

## 2019-04-17 DIAGNOSIS — R5383 Other fatigue: Secondary | ICD-10-CM

## 2019-04-17 DIAGNOSIS — M5136 Other intervertebral disc degeneration, lumbar region: Secondary | ICD-10-CM | POA: Diagnosis not present

## 2019-04-17 DIAGNOSIS — M19042 Primary osteoarthritis, left hand: Secondary | ICD-10-CM

## 2019-04-17 DIAGNOSIS — M7061 Trochanteric bursitis, right hip: Secondary | ICD-10-CM

## 2019-04-17 DIAGNOSIS — G8929 Other chronic pain: Secondary | ICD-10-CM

## 2019-04-20 ENCOUNTER — Ambulatory Visit: Payer: BC Managed Care – PPO | Admitting: Physician Assistant

## 2019-05-02 ENCOUNTER — Other Ambulatory Visit: Payer: Self-pay | Admitting: Physician Assistant

## 2019-05-02 NOTE — Telephone Encounter (Signed)
Last Visit: 04/17/2019 telemedicine  Next Visit: 11/09/2019 UDS: 01/03/2019 c/w Narc Agreement:01/03/2019  Last fill: 03/20/2019   Okay to refill tramadol?

## 2019-06-21 ENCOUNTER — Other Ambulatory Visit: Payer: Self-pay | Admitting: Rheumatology

## 2019-06-21 NOTE — Telephone Encounter (Signed)
Last Visit: 04/17/2019 telemedicine  Next Visit: 11/09/2019 UDS: 01/03/2019 c/w Narc Agreement:01/03/2019  Last fill: 05/02/19  Okay to refill tramadol?

## 2019-08-09 DIAGNOSIS — J310 Chronic rhinitis: Secondary | ICD-10-CM | POA: Diagnosis not present

## 2019-08-09 DIAGNOSIS — J343 Hypertrophy of nasal turbinates: Secondary | ICD-10-CM | POA: Diagnosis not present

## 2019-08-11 ENCOUNTER — Telehealth: Payer: Self-pay | Admitting: Rheumatology

## 2019-08-11 MED ORDER — TRAMADOL HCL 50 MG PO TABS: ORAL_TABLET | ORAL | 0 refills | Status: DC

## 2019-08-11 NOTE — Telephone Encounter (Signed)
Patient called requesting prescription refill of Tramadol to be sent to [facility] at [place].  Patient states she will take her last pill on Sunday, 08/13/19.

## 2019-08-11 NOTE — Telephone Encounter (Signed)
Last Visit: 04/17/2019 telemedicine  Next Visit: 11/09/2019 UDS: 01/03/2019 c/w Narc Agreement:01/03/2019  Last fill: 06/21/2019  Okay to refill tramadol?

## 2019-08-23 DIAGNOSIS — Z6820 Body mass index (BMI) 20.0-20.9, adult: Secondary | ICD-10-CM | POA: Diagnosis not present

## 2019-08-23 DIAGNOSIS — Z1231 Encounter for screening mammogram for malignant neoplasm of breast: Secondary | ICD-10-CM | POA: Diagnosis not present

## 2019-08-23 DIAGNOSIS — Z01419 Encounter for gynecological examination (general) (routine) without abnormal findings: Secondary | ICD-10-CM | POA: Diagnosis not present

## 2019-08-31 DIAGNOSIS — N891 Moderate vaginal dysplasia: Secondary | ICD-10-CM | POA: Diagnosis not present

## 2019-08-31 DIAGNOSIS — N893 Dysplasia of vagina, unspecified: Secondary | ICD-10-CM | POA: Diagnosis not present

## 2019-08-31 DIAGNOSIS — R87612 Low grade squamous intraepithelial lesion on cytologic smear of cervix (LGSIL): Secondary | ICD-10-CM | POA: Diagnosis not present

## 2019-09-12 ENCOUNTER — Inpatient Hospital Stay: Payer: BC Managed Care – PPO | Attending: Gynecologic Oncology | Admitting: Gynecologic Oncology

## 2019-09-12 ENCOUNTER — Encounter: Payer: Self-pay | Admitting: Gynecologic Oncology

## 2019-09-12 ENCOUNTER — Other Ambulatory Visit: Payer: Self-pay

## 2019-09-12 VITALS — BP 129/88 | HR 97 | Temp 98.9°F | Resp 20 | Ht 63.0 in | Wt 114.2 lb

## 2019-09-12 DIAGNOSIS — Z9079 Acquired absence of other genital organ(s): Secondary | ICD-10-CM | POA: Diagnosis not present

## 2019-09-12 DIAGNOSIS — Z87891 Personal history of nicotine dependence: Secondary | ICD-10-CM | POA: Diagnosis not present

## 2019-09-12 DIAGNOSIS — Z90710 Acquired absence of both cervix and uterus: Secondary | ICD-10-CM | POA: Diagnosis not present

## 2019-09-12 DIAGNOSIS — N941 Unspecified dyspareunia: Secondary | ICD-10-CM | POA: Insufficient documentation

## 2019-09-12 DIAGNOSIS — A630 Anogenital (venereal) warts: Secondary | ICD-10-CM | POA: Insufficient documentation

## 2019-09-12 DIAGNOSIS — D072 Carcinoma in situ of vagina: Secondary | ICD-10-CM

## 2019-09-12 MED ORDER — SENNOSIDES-DOCUSATE SODIUM 8.6-50 MG PO TABS: 2.0000 | ORAL_TABLET | Freq: Every day | ORAL | 0 refills | Status: DC

## 2019-09-12 MED ORDER — TRAMADOL HCL 50 MG PO TABS: 50.0000 mg | ORAL_TABLET | Freq: Four times a day (QID) | ORAL | 0 refills | Status: DC | PRN

## 2019-09-12 NOTE — Patient Instructions (Addendum)
Dr Illene Bolus at [facility] is the pelvic pain specialist gynecologist.   Preparing for your Surgery  Plan for surgery on September 21, 2019 with Dr. Everitt Amber at [facility]. You will be scheduled for a vaginal laser.   Pre-operative Testing -You will receive a phone call from presurgical testing at [facility] to arrange for a pre-operative appointment over the phone, lab appointment, and COVID test. The COVID test normally happens 3 days prior to the surgery and they ask that you self quarantine after the test up until surgery to decrease chance of exposure.  -Bring your insurance card, copy of an advanced directive if applicable, medication list  -You should not be taking blood thinners or aspirin at least ten days prior to surgery unless instructed by your surgeon.  -Do not take supplements such as fish oil (omega 3), red yeast rice, turmeric before your surgery.   Day Before Surgery at [place] will be advised you can have clear liquids after midnight and up until 3 hours before your surgery.    Your role in recovery Your role is to become active as soon as directed by your doctor, while still giving yourself time to heal.  Rest when you feel tired. You will be asked to do the following in order to speed your recovery:  - Cough and breathe deeply. This helps to clear and expand your lungs and can prevent pneumonia after surgery.  - [place]. Do mild physical activity. Walking or moving your legs help your circulation and body functions return to normal. Do not try to get up or walk alone the first time after surgery.   -If you develop swelling on one leg or the other, pain in the back of your leg, redness/warmth in one of your legs, please call the office or go to the Emergency Room to have a doppler to rule out a blood clot. For shortness of breath, chest pain-seek care in the Emergency Room as soon as possible. - Actively manage your  pain. Managing your pain lets you move in comfort. We will ask you to rate your pain on a scale of zero to 10. It is your responsibility to tell your doctor or nurse where and how much you hurt so your pain can be treated.  -FMLA forms can be faxed to [phone] and please allow 5-7 business days for completion.  Pain Management After Surgery -You will be prescribed your pain medication and bowel regimen medications before surgery so that you can have these available when you are discharged from the hospital. The pain medication is for use ONLY AFTER surgery and a new prescription will not be given.   -Make sure that you have Tylenol at home to use on a regular basis after surgery for pain control.  Bowel Regimen -You have been prescribed Sennakot-S to take nightly to prevent constipation especially if you are taking the narcotic pain medication intermittently.  It is important to prevent constipation and drink adequate amounts of liquids. You can stop taking this medication when you are not taking pain medication and you are back on your normal bowel routine.  Risks of Surgery Risks of surgery are low but include bleeding, infection, damage to surrounding structures, re-operation, blood clots, and very rarely death.   AFTER SURGERY INSTRUCTIONS  Return to work: 1-2 weeks if applicable  Activity: 1. Be up and out of the bed during the day.  Take  a nap if needed.  You may walk up steps but be careful and use the hand rail.  Stair climbing will tire you more than you think, you may need to stop part way and rest.   2. No lifting or straining for 2 weeks over 10 pounds. No pushing, pulling, straining for 6 weeks.  3. No driving for 48 hours after the procedure.  Do not drive if you are taking narcotic pain medicine and make sure that your reaction time has returned.   4. You can shower as soon as the next day after surgery. Shower daily.  Use soap and water on your incision and pat dry;  don't rub.  No tub baths or submerging your body in water until cleared by your surgeon. If you have the soap that was given to you by pre-surgical testing that was used before surgery, you do not need to use it afterwards because this can irritate your incisions.   5. No sexual activity and nothing in the vagina for 4 weeks.  6. You may experience vaginal spotting/drainage after surgery. The spotting is normal but if you experience heavy bleeding, call our office.  9. Take Tylenol first for pain and only use narcotic pain medication for severe pain not relieved by the Tylenol.  Monitor your Tylenol intake to a max of 4,000 mg in a 24 hour period.   Diet: 1. Low sodium Heart Healthy Diet is recommended.  2. It is safe to use a laxative, such as Miralax or Colace, if you have difficulty moving your bowels. You have been prescribed Sennakot at bedtime every evening to keep bowel movements regular and to prevent constipation.    Wound Care: 1. Keep clean and dry.  Shower daily.  Reasons to call the Doctor:  Fever - Oral temperature greater than 100.4 degrees Fahrenheit  Foul-smelling vaginal discharge  Difficulty urinating  Nausea and vomiting  Increased pain at the site of the incision that is unrelieved with pain medicine.  Difficulty breathing with or without chest pain  New calf pain especially if only on one side  Sudden, continuing increased vaginal bleeding with or without clots.   Contacts: For questions or concerns you should contact:  Dr. Everitt Amber at [phone]  Joylene John, NP at [phone]  After Hours: call [phone] and have the GYN Oncologist paged/contacted

## 2019-09-12 NOTE — Progress Notes (Signed)
Consult Note: Gyn-Onc  Consult was requested by Dr. Matthew Saras for the evaluation of [name] 54 y.o. female  CC:  Chief Complaint  Patient presents with   VAIN III (vaginal intraepithelial neoplasia grade III)    New Patient    Assessment/Plan:  Ms. [name]  is a 54 y.o.  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 I am recommending vaginal laser of the upper vagina.  We discussed alternative options including resection which may be associated with progressive scarring and shortening of the vagina and 5-FU Efudex treatment which I am concerned about given her pre-existing chronic vaginal pain.    I discussed the laser procedure, its anticipated recovery, and the expectation that there is a 25 to 50% risk for recurrence after this procedure.  I explained that the recurrence is inherent due to her prolonged HPV infection and there is little that can be done to mitigate this other than cessation of smoking.  With respect to her chronic dyspareunia, this may become worse after treatment for her vaginal dysplasia.  I am recommending follow-up with a pelvic pain specialist such as Dr. Illene Bolus after she is completed recovery for her dysplasia.  HPI: Ms [name] is a 54 year old P1 who was seen in consultation at the request of Dr. Matthew Saras for evaluation of VAIN 2-3.  The patient reports having history of abnormal Paps since age 85.  This required periodic cervical freezing or excisional procedures at age 38, later during pregnancy, and again menopause.  She underwent a hysterectomy via LAVH with Dr. Matthew Saras in 2024 pelvic organ prolapse.  Final pathology from that specimen confirmed HPV associated changes with koilocytotic atypia but no malignancy or dysplasia.  She underwent subsequent Pap test in March 2021 which revealed low-grade dysplasia positive for high-risk HPV.  Follow-up colposcopic directed biopsies on August 31, 2019 showed some  acetowhite changes at the vaginal cuff with biopsies taken at the right anterior proximal vaginal wall and just posterior to the right vaginal cuff both of which showed high-grade squamous intraepithelial lesion VAIN 2-3 moderate to severe squamous dysplasia.  Patient has a history of prolonged tobacco use with cessation at age 54.  She now uses vaporized nicotine.  She has a surgical history significant for cesarean section, tubal ligation, appendectomy, cervical spine fusion, and hysterectomy.  Following her hysterectomy she had development of severe both superficial and deep dyspareunia.  This was not helped much with the addition of vaginal estrogen cream.  Her dyspareunia is so quality of life limiting that she no longer engages in intercourse with her husband which is extremely upsetting to her.  Current Meds:  Outpatient Encounter Medications as of 09/12/2019  Medication Sig   calcium carbonate (TUMS - DOSED IN MG ELEMENTAL CALCIUM) 500 MG chewable tablet Chew 2-4 tablets by mouth daily as needed for indigestion or heartburn.   Ferrous Sulfate (IRON PO) Take 85 mg by mouth daily with lunch.   Magnesium 400 MG TABS Take 400 mg by mouth 4 (four) times daily.   senna-docusate (SENOKOT-S) 8.6-50 MG tablet Take 2 tablets by mouth at bedtime. For AFTER surgery, do not take if having diarrhea   Sodium Chloride-Sodium Bicarb (NETI POT SINUS WASH NA) Place 1 Dose into the nose daily as needed (congestion).   topiramate (TOPAMAX) 25 MG tablet TAKE 2 TABLETS BY MOUTH AT BEDTIME   traMADol (ULTRAM) 50 MG tablet Take 1-2 tablets by mouth twice daily as  needed.   traMADol (ULTRAM) 50 MG tablet Take 1 tablet (50 mg total) by mouth every 6 (six) hours as needed for severe pain. For AFTER surgery only, do not take and drive   [DISCONTINUED] Phenylephrine-DM-GG-APAP (MUCUS RELIEF SEVERE CONG/COLD PO) Take 1 tablet by mouth 4 (four) times daily as needed (allergies).  (Patient not taking: Reported on  09/12/2019)   [DISCONTINUED] triamcinolone cream (KENALOG) 0.1 % Apply 1 application topically daily as needed (yeast).  (Patient not taking: Reported on 09/12/2019)   No facility-administered encounter medications on file as of 09/12/2019.    Allergy:  Allergies  Allergen Reactions   Lyrica [Pregabalin]     Altered mental state    Social Hx:   Social History   Socioeconomic History   Marital status: Married    Spouse name: Not on file   Number of children: Not on file   Years of education: Not on file   Highest education level: Not on file  Occupational History   Not on file  Tobacco Use   Smoking status: Former Smoker    Years: 10.00    Types: E-cigarettes   Smokeless tobacco: Never Used  Scientific laboratory technician Use: Every day   Devices: Voopo  Substance and Sexual Activity   Alcohol use: Not Currently   Drug use: No   Sexual activity: Not on file  Other Topics Concern   Not on file  Social History Narrative   Patient lives at home with her husband and son. She has her BS and she is not working.    Social Determinants of Health   Financial Resource Strain:    Difficulty of Paying Living Expenses:   Food Insecurity:    Worried About Charity fundraiser in the Last Year:    Arboriculturist in the Last Year:   Transportation Needs:    Film/video editor (Medical):    Lack of Transportation (Non-Medical):   Physical Activity:    Days of Exercise per Week:    Minutes of Exercise per Session:   Stress:    Feeling of Stress :   Social Connections:    Frequency of Communication with Friends and Family:    Frequency of Social Gatherings with Friends and Family:    Attends Religious Services:    Active Member of Clubs or Organizations:    Attends Music therapist:    Marital Status:   Intimate Partner Violence:    Fear of Current or Ex-Partner:    Emotionally Abused:    Physically Abused:    Sexually Abused:      Past Surgical Hx:  Past Surgical History:  Procedure Laterality Date   ANTERIOR AND POSTERIOR REPAIR WITH SACROSPINOUS FIXATION N/A 08/01/2018   Procedure: ANTERIOR REPAIR;  Surgeon: Molli Posey, MD;  Location: [facility];  Service: Gynecology;  Laterality: N/A;   APPENDECTOMY     CERVICAL FUSION     CESAREAN SECTION     HEMORRHOID SURGERY     LAPAROSCOPIC VAGINAL HYSTERECTOMY WITH SALPINGECTOMY Bilateral 08/01/2018   Procedure: LAPAROSCOPIC ASSISTED VAGINAL HYSTERECTOMY WITH SALPINGECTOMY;  Surgeon: Molli Posey, MD;  Location: [facility];  Service: Gynecology;  Laterality: Bilateral;  need bed   NASAL SEPTUM SURGERY     TUBAL LIGATION     UTERINE POLYPS  2019    Past Medical Hx:  Past Medical History:  Diagnosis Date   Esophageal spasm 2019   Fibromyalgia    Intervertebral  disk disease    Migraine     Past Gynecological History:  See HPI Patient's last menstrual period was 01/15/2018.  Family Hx:  Family History  Problem Relation Age of Onset   Breast cancer Paternal Grandmother     Review of Systems:  Constitutional  Feels well,   ENT Normal appearing ears and nares bilaterally Skin/Breast  No rash, sores, jaundice, itching, dryness Cardiovascular  No chest pain, shortness of breath, or edema  Pulmonary  No cough or wheeze.  Gastro Intestinal  No nausea, vomitting, or diarrhoea. No bright red blood per rectum, no abdominal pain, change in bowel movement, or constipation.  Genito Urinary  No frequency, urgency, dysuria, +vaginal pain Musculo Skeletal  No myalgia, arthralgia, joint swelling or pain  Neurologic  No weakness, numbness, change in gait,  Psychology  No depression, anxiety, insomnia.   Vitals:  Blood pressure 129/88, pulse 97, temperature 98.9 F (37.2 C), resp. rate 20, height 5\' 3"  (1.6 m), weight 114 lb 3.2 oz (51.8 kg), last menstrual period 01/15/2018, SpO2 100 %.  Physical  Exam: WD in NAD Neck  Supple NROM, without any enlargements.  Lymph Node Survey No cervical supraclavicular or inguinal adenopathy Cardiovascular  Pulse normal rate, regularity and rhythm. S1 and S2 normal.  Lungs  Clear to auscultation bilateraly, without wheezes/crackles/rhonchi. Good air movement.  Skin  No rash/lesions/breakdown  Psychiatry  Alert and oriented to person, place, and time  Abdomen  Normoactive bowel sounds, abdomen soft, non-tender and thin without evidence of hernia.  Back No CVA tenderness Genito Urinary  Vulva/vagina: Normal external female genitalia.   No lesions. No discharge or bleeding.  Bladder/urethra:  No lesions or masses, well supported bladder  Vagina: biopsy sites at the anterior proximal right vaginal wall and just posterior to the right vaginal cuff. Mild erythema at these sites. Palpably normal vagina. Rectal  deferred Extremities  No bilateral cyanosis, clubbing or edema.   Thereasa Solo, MD  09/12/2019, 5:23 PM

## 2019-09-12 NOTE — H&P (View-Only) (Signed)
Consult Note: Gyn-Onc  Consult was requested by Dr. Matthew Saras for the evaluation of [name] 54 y.o. female  CC:  Chief Complaint  Patient presents with   VAIN III (vaginal intraepithelial neoplasia grade III)    New Patient    Assessment/Plan:  [name]  is a 54 y.o.  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 I am recommending vaginal laser of the upper vagina.  We discussed alternative options including resection which may be associated with progressive scarring and shortening of the vagina and 5-FU Efudex treatment which I am concerned about given her pre-existing chronic vaginal pain.    I discussed the laser procedure, its anticipated recovery, and the expectation that there is a 25 to 50% risk for recurrence after this procedure.  I explained that the recurrence is inherent due to her prolonged HPV infection and there is little that can be done to mitigate this other than cessation of smoking.  With respect to her chronic dyspareunia, this may become worse after treatment for her vaginal dysplasia.  I am recommending follow-up with a pelvic pain specialist such as Dr. Illene Bolus after she is completed recovery for her dysplasia.  HPI: [name] is a 54 year old P1 who was seen in consultation at the request of Dr. Matthew Saras for evaluation of VAIN 2-3.  The patient reports having history of abnormal Paps since age 10.  This required periodic cervical freezing or excisional procedures at age 27, later during pregnancy, and again menopause.  She underwent a hysterectomy via LAVH with Dr. Matthew Saras in 2024 pelvic organ prolapse.  Final pathology from that specimen confirmed HPV associated changes with koilocytotic atypia but no malignancy or dysplasia.  She underwent subsequent Pap test in March 2021 which revealed low-grade dysplasia positive for high-risk HPV.  Follow-up colposcopic directed biopsies on August 31, 2019 showed some  acetowhite changes at the vaginal cuff with biopsies taken at the right anterior proximal vaginal wall and just posterior to the right vaginal cuff both of which showed high-grade squamous intraepithelial lesion VAIN 2-3 moderate to severe squamous dysplasia.  Patient has a history of prolonged tobacco use with cessation at age 25.  She now uses vaporized nicotine.  She has a surgical history significant for cesarean section, tubal ligation, appendectomy, cervical spine fusion, and hysterectomy.  Following her hysterectomy she had development of severe both superficial and deep dyspareunia.  This was not helped much with the addition of vaginal estrogen cream.  Her dyspareunia is so quality of life limiting that she no longer engages in intercourse with her husband which is extremely upsetting to her.  Current Meds:  Outpatient Encounter Medications as of 09/12/2019  Medication Sig   calcium carbonate (TUMS - DOSED IN MG ELEMENTAL CALCIUM) 500 MG chewable tablet Chew 2-4 tablets by mouth daily as needed for indigestion or heartburn.   Ferrous Sulfate (IRON PO) Take 85 mg by mouth daily with lunch.   Magnesium 400 MG TABS Take 400 mg by mouth 4 (four) times daily.   senna-docusate (SENOKOT-S) 8.6-50 MG tablet Take 2 tablets by mouth at bedtime. For AFTER surgery, do not take if having diarrhea   Sodium Chloride-Sodium Bicarb (NETI POT SINUS WASH NA) Place 1 Dose into the nose daily as needed (congestion).   topiramate (TOPAMAX) 25 MG tablet TAKE 2 TABLETS BY MOUTH AT BEDTIME   traMADol (ULTRAM) 50 MG tablet Take 1-2 tablets by mouth twice daily as  needed.   traMADol (ULTRAM) 50 MG tablet Take 1 tablet (50 mg total) by mouth every 6 (six) hours as needed for severe pain. For AFTER surgery only, do not take and drive   [DISCONTINUED] Phenylephrine-DM-GG-APAP (MUCUS RELIEF SEVERE CONG/COLD PO) Take 1 tablet by mouth 4 (four) times daily as needed (allergies).  (Patient not taking: Reported on  09/12/2019)   [DISCONTINUED] triamcinolone cream (KENALOG) 0.1 % Apply 1 application topically daily as needed (yeast).  (Patient not taking: Reported on 09/12/2019)   No facility-administered encounter medications on file as of 09/12/2019.    Allergy:  Allergies  Allergen Reactions   Lyrica [Pregabalin]     Altered mental state    Social Hx:   Social History   Socioeconomic History   Marital status: Married    Spouse name: Not on file   Number of children: Not on file   Years of education: Not on file   Highest education level: Not on file  Occupational History   Not on file  Tobacco Use   Smoking status: Former Smoker    Years: 10.00    Types: E-cigarettes   Smokeless tobacco: Never Used  Scientific laboratory technician Use: Every day   Devices: Voopo  Substance and Sexual Activity   Alcohol use: Not Currently   Drug use: No   Sexual activity: Not on file  Other Topics Concern   Not on file  Social History Narrative   Patient lives at home with her husband and son. She has her BS and she is not working.    Social Determinants of Health   Financial Resource Strain:    Difficulty of Paying Living Expenses:   Food Insecurity:    Worried About Charity fundraiser in the Last Year:    Arboriculturist in the Last Year:   Transportation Needs:    Film/video editor (Medical):    Lack of Transportation (Non-Medical):   Physical Activity:    Days of Exercise per Week:    Minutes of Exercise per Session:   Stress:    Feeling of Stress :   Social Connections:    Frequency of Communication with Friends and Family:    Frequency of Social Gatherings with Friends and Family:    Attends Religious Services:    Active Member of Clubs or Organizations:    Attends Music therapist:    Marital Status:   Intimate Partner Violence:    Fear of Current or Ex-Partner:    Emotionally Abused:    Physically Abused:    Sexually Abused:      Past Surgical Hx:  Past Surgical History:  Procedure Laterality Date   ANTERIOR AND POSTERIOR REPAIR WITH SACROSPINOUS FIXATION N/A 08/01/2018   Procedure: ANTERIOR REPAIR;  Surgeon: Molli Posey, MD;  Location: [place];  Service: Gynecology;  Laterality: N/A;   APPENDECTOMY     CERVICAL FUSION     CESAREAN SECTION     HEMORRHOID SURGERY     LAPAROSCOPIC VAGINAL HYSTERECTOMY WITH SALPINGECTOMY Bilateral 08/01/2018   Procedure: LAPAROSCOPIC ASSISTED VAGINAL HYSTERECTOMY WITH SALPINGECTOMY;  Surgeon: Molli Posey, MD;  Location: [facility];  Service: Gynecology;  Laterality: Bilateral;  need bed   NASAL SEPTUM SURGERY     TUBAL LIGATION     UTERINE POLYPS  2019    Past Medical Hx:  Past Medical History:  Diagnosis Date   Esophageal spasm 2019   Fibromyalgia    Intervertebral  disk disease    Migraine     Past Gynecological History:  See HPI Patient's last menstrual period was 01/15/2018.  Family Hx:  Family History  Problem Relation Age of Onset   Breast cancer Paternal Grandmother     Review of Systems:  Constitutional  Feels well,   ENT Normal appearing ears and nares bilaterally Skin/Breast  No rash, sores, jaundice, itching, dryness Cardiovascular  No chest pain, shortness of breath, or edema  Pulmonary  No cough or wheeze.  Gastro Intestinal  No nausea, vomitting, or diarrhoea. No bright red blood per rectum, no abdominal pain, change in bowel movement, or constipation.  Genito Urinary  No frequency, urgency, dysuria, +vaginal pain Musculo Skeletal  No myalgia, arthralgia, joint swelling or pain  Neurologic  No weakness, numbness, change in gait,  Psychology  No depression, anxiety, insomnia.   Vitals:  Blood pressure 129/88, pulse 97, temperature 98.9 F (37.2 C), resp. rate 20, height 5\' 3"  (1.6 m), weight 114 lb 3.2 oz (51.8 kg), last menstrual period 01/15/2018, SpO2 100 %.  Physical  Exam: WD in NAD Neck  Supple NROM, without any enlargements.  Lymph Node Survey No cervical supraclavicular or inguinal adenopathy Cardiovascular  Pulse normal rate, regularity and rhythm. S1 and S2 normal.  Lungs  Clear to auscultation bilateraly, without wheezes/crackles/rhonchi. Good air movement.  Skin  No rash/lesions/breakdown  Psychiatry  Alert and oriented to person, place, and time  Abdomen  Normoactive bowel sounds, abdomen soft, non-tender and thin without evidence of hernia.  Back No CVA tenderness Genito Urinary  Vulva/vagina: Normal external female genitalia.   No lesions. No discharge or bleeding.  Bladder/urethra:  No lesions or masses, well supported bladder  Vagina: biopsy sites at the anterior proximal right vaginal wall and just posterior to the right vaginal cuff. Mild erythema at these sites. Palpably normal vagina. Rectal  deferred Extremities  No bilateral cyanosis, clubbing or edema.   Thereasa Solo, MD  09/12/2019, 5:23 PM

## 2019-09-15 ENCOUNTER — Other Ambulatory Visit: Payer: Self-pay

## 2019-09-15 ENCOUNTER — Encounter (HOSPITAL_BASED_OUTPATIENT_CLINIC_OR_DEPARTMENT_OTHER): Payer: Self-pay | Admitting: Gynecologic Oncology

## 2019-09-15 NOTE — Progress Notes (Signed)
NEW Covid Policy July 1423  Surgery Day:  09-21-19  Facility:  [facility]  Type of Surgery:co2 vaporization of vagina  Fully Covid Vaccinated:   1)06-07-2019                                          2)06-28-2019                                          Where[facility]                                           Type?pfizer Patient aware to bring covid vaccination card day of surgery  Do you have symptoms? no  In the past 14 days:no        Have you had any symptoms? no       Have you been tested covid positive?no       Have you been in contact with someone covid positive?no        Is pt Immuno-compromised? no

## 2019-09-15 NOTE — Progress Notes (Signed)
Spoke w/ via phone for pre-op interview---pt Lab needs dos----  none            Lab results------has lab appt 09-19-19 at 1000 am for cbc, bmet COVID test ------not needed fully vaccinated Arrive at -------1000 am 09-21-19 No food after midnight clear liquids from midnight until 900 am then npo Medications to take morning of surgery -----none Diabetic medication -----n/a Patient Special Instructions -----patient aware bring covid vaccination card day of surgery Pre-Op special Istructions -----none Patient verbalized understanding of instructions that were given at this phone interview. Patient denies shortness of breath, chest pain, fever, cough a this phone interview.

## 2019-09-19 ENCOUNTER — Encounter (HOSPITAL_COMMUNITY)
Admission: RE | Admit: 2019-09-19 | Discharge: 2019-09-19 | Disposition: A | Discharge status: Home / Self Care | Payer: BC Managed Care – PPO | Source: Ambulatory Visit | Attending: Gynecologic Oncology | Admitting: Gynecologic Oncology

## 2019-09-19 ENCOUNTER — Other Ambulatory Visit: Payer: Self-pay

## 2019-09-19 DIAGNOSIS — Z01812 Encounter for preprocedural laboratory examination: Secondary | ICD-10-CM | POA: Diagnosis not present

## 2019-09-19 LAB — BASIC METABOLIC PANEL
Anion gap: 7 (ref 5–15)
BUN: 16 mg/dL (ref 6–20)
CO2: 27 mmol/L (ref 22–32)
Calcium: 9.8 mg/dL (ref 8.9–10.3)
Chloride: 105 mmol/L (ref 98–111)
Creatinine, Ser: 0.88 mg/dL (ref 0.44–1.00)
GFR calc Af Amer: >60 mL/min (ref >60)
GFR calc non Af Amer: >60 mL/min (ref >60)
Glucose, Bld: 101 mg/dL — ABNORMAL HIGH (ref 70–99)
Potassium: 4.5 mmol/L (ref 3.5–5.1)
Sodium: 139 mmol/L (ref 135–145)

## 2019-09-19 LAB — CBC
HCT: 37.8 % (ref 36.0–46.0)
Hemoglobin: 12.2 g/dL (ref 12.0–15.0)
MCH: 31.9 pg (ref 26.0–34.0)
MCHC: 32.3 g/dL (ref 30.0–36.0)
MCV: 98.7 fL (ref 80.0–100.0)
Platelets: 288 10*3/uL (ref 150–400)
RBC: 3.83 MIL/uL — ABNORMAL LOW (ref 3.87–5.11)
RDW: 12.8 % (ref 11.5–15.5)
WBC: 4.8 10*3/uL (ref 4.0–10.5)
nRBC: 0 % (ref 0.0–0.2)

## 2019-09-20 ENCOUNTER — Telehealth: Payer: Self-pay

## 2019-09-20 NOTE — Telephone Encounter (Signed)
Spoke with patient in regards to pre op instructions for surgery 09/21/19.  Patient reports that she understands and does not have any questions at this time.

## 2019-09-21 ENCOUNTER — Encounter (HOSPITAL_BASED_OUTPATIENT_CLINIC_OR_DEPARTMENT_OTHER): Payer: Self-pay | Admitting: Gynecologic Oncology

## 2019-09-21 ENCOUNTER — Encounter (HOSPITAL_BASED_OUTPATIENT_CLINIC_OR_DEPARTMENT_OTHER)
Admission: RE | Disposition: A | Discharge status: Home / Self Care | Payer: Self-pay | Source: Home / Self Care | Attending: Gynecologic Oncology

## 2019-09-21 ENCOUNTER — Ambulatory Visit (HOSPITAL_BASED_OUTPATIENT_CLINIC_OR_DEPARTMENT_OTHER): Payer: BC Managed Care – PPO | Admitting: Anesthesiology

## 2019-09-21 ENCOUNTER — Ambulatory Visit (HOSPITAL_BASED_OUTPATIENT_CLINIC_OR_DEPARTMENT_OTHER)
Admission: RE | Admit: 2019-09-21 | Discharge: 2019-09-21 | Disposition: A | Discharge status: Home / Self Care | Payer: BC Managed Care – PPO | Attending: Gynecologic Oncology | Admitting: Gynecologic Oncology

## 2019-09-21 ENCOUNTER — Other Ambulatory Visit: Payer: Self-pay

## 2019-09-21 DIAGNOSIS — Z9079 Acquired absence of other genital organ(s): Secondary | ICD-10-CM | POA: Diagnosis not present

## 2019-09-21 DIAGNOSIS — F17290 Nicotine dependence, other tobacco product, uncomplicated: Secondary | ICD-10-CM | POA: Insufficient documentation

## 2019-09-21 DIAGNOSIS — M797 Fibromyalgia: Secondary | ICD-10-CM | POA: Insufficient documentation

## 2019-09-21 DIAGNOSIS — N941 Unspecified dyspareunia: Secondary | ICD-10-CM | POA: Diagnosis not present

## 2019-09-21 DIAGNOSIS — R87810 Cervical high risk human papillomavirus (HPV) DNA test positive: Secondary | ICD-10-CM | POA: Insufficient documentation

## 2019-09-21 DIAGNOSIS — Z981 Arthrodesis status: Secondary | ICD-10-CM | POA: Insufficient documentation

## 2019-09-21 DIAGNOSIS — Z79899 Other long term (current) drug therapy: Secondary | ICD-10-CM | POA: Diagnosis not present

## 2019-09-21 DIAGNOSIS — D072 Carcinoma in situ of vagina: Secondary | ICD-10-CM | POA: Diagnosis not present

## 2019-09-21 DIAGNOSIS — M19049 Primary osteoarthritis, unspecified hand: Secondary | ICD-10-CM | POA: Diagnosis not present

## 2019-09-21 HISTORY — PX: CO2 LASER APPLICATION: SHX5778

## 2019-09-21 HISTORY — DX: Dysplasia of vagina, unspecified: N89.3

## 2019-09-21 HISTORY — DX: Anemia, unspecified: D64.9

## 2019-09-21 SURGERY — CO2 LASER APPLICATION
Anesthesia: General

## 2019-09-21 MED ORDER — ACETAMINOPHEN 500 MG PO TABS
1000.0000 mg | ORAL_TABLET | ORAL | Status: AC
  Administered 2019-09-21: 1000 mg via ORAL

## 2019-09-21 MED ORDER — FENTANYL CITRATE (PF) 100 MCG/2ML IJ SOLN
INTRAMUSCULAR | Status: AC
  Filled 2019-09-21: qty 2

## 2019-09-21 MED ORDER — OXYCODONE HCL 5 MG PO TABS
5.0000 mg | ORAL_TABLET | Freq: Once | ORAL | Status: AC | PRN
  Administered 2019-09-21: 5 mg via ORAL

## 2019-09-21 MED ORDER — HYDROMORPHONE HCL 1 MG/ML IJ SOLN: 0.2000 mg | INTRAMUSCULAR | Status: DC | PRN

## 2019-09-21 MED ORDER — SCOPOLAMINE 1 MG/3DAYS TD PT72
MEDICATED_PATCH | TRANSDERMAL | Status: AC
  Filled 2019-09-21: qty 1

## 2019-09-21 MED ORDER — ACETIC ACID 5 % SOLN
Status: DC | PRN
  Administered 2019-09-21: 1 via TOPICAL

## 2019-09-21 MED ORDER — MIDAZOLAM HCL 2 MG/2ML IJ SOLN
INTRAMUSCULAR | Status: DC | PRN
  Administered 2019-09-21: 2 mg via INTRAVENOUS

## 2019-09-21 MED ORDER — OXYCODONE HCL 5 MG PO TABS
ORAL_TABLET | ORAL | Status: AC
  Filled 2019-09-21: qty 1

## 2019-09-21 MED ORDER — PROPOFOL 10 MG/ML IV BOLUS
INTRAVENOUS | Status: AC
  Filled 2019-09-21: qty 20

## 2019-09-21 MED ORDER — FENTANYL CITRATE (PF) 100 MCG/2ML IJ SOLN
INTRAMUSCULAR | Status: DC | PRN
  Administered 2019-09-21: 25 ug via INTRAVENOUS
  Administered 2019-09-21: 50 ug via INTRAVENOUS
  Administered 2019-09-21: 25 ug via INTRAVENOUS

## 2019-09-21 MED ORDER — KETOROLAC TROMETHAMINE 30 MG/ML IJ SOLN: 30.0000 mg | Freq: Once | INTRAMUSCULAR | Status: DC

## 2019-09-21 MED ORDER — FENTANYL CITRATE (PF) 100 MCG/2ML IJ SOLN
25.0000 ug | INTRAMUSCULAR | Status: DC | PRN
  Administered 2019-09-21 (×2): 25 ug via INTRAVENOUS

## 2019-09-21 MED ORDER — OXYCODONE HCL 5 MG PO TABS: 5.0000 mg | ORAL_TABLET | ORAL | Status: DC | PRN

## 2019-09-21 MED ORDER — LACTATED RINGERS IV SOLN
INTRAVENOUS | Status: DC
  Administered 2019-09-21: 75 mL via INTRAVENOUS

## 2019-09-21 MED ORDER — ACETAMINOPHEN 500 MG PO TABS: 1000.0000 mg | ORAL_TABLET | Freq: Four times a day (QID) | ORAL | Status: DC

## 2019-09-21 MED ORDER — ACETAMINOPHEN 10 MG/ML IV SOLN: 1000.0000 mg | Freq: Four times a day (QID) | INTRAVENOUS | Status: DC

## 2019-09-21 MED ORDER — ACETAMINOPHEN 500 MG PO TABS
ORAL_TABLET | ORAL | Status: AC
  Filled 2019-09-21: qty 2

## 2019-09-21 MED ORDER — LIDOCAINE 2% (20 MG/ML) 5 ML SYRINGE
INTRAMUSCULAR | Status: AC
  Filled 2019-09-21: qty 5

## 2019-09-21 MED ORDER — DEXAMETHASONE SODIUM PHOSPHATE 10 MG/ML IJ SOLN
INTRAMUSCULAR | Status: DC | PRN
Start: 2019-09-21 — End: 2019-09-21
  Administered 2019-09-21: 5 mg via INTRAVENOUS

## 2019-09-21 MED ORDER — SCOPOLAMINE 1 MG/3DAYS TD PT72
1.0000 | MEDICATED_PATCH | TRANSDERMAL | Status: DC
  Administered 2019-09-21: 1.5 mg via TRANSDERMAL

## 2019-09-21 MED ORDER — ONDANSETRON HCL 4 MG/2ML IJ SOLN
INTRAMUSCULAR | Status: DC | PRN
  Administered 2019-09-21: 4 mg via INTRAVENOUS

## 2019-09-21 MED ORDER — DEXAMETHASONE SODIUM PHOSPHATE 10 MG/ML IJ SOLN: 4.0000 mg | INTRAMUSCULAR | Status: DC

## 2019-09-21 MED ORDER — LIDOCAINE HCL (CARDIAC) PF 100 MG/5ML IV SOSY
PREFILLED_SYRINGE | INTRAVENOUS | Status: DC | PRN
  Administered 2019-09-21: 60 mg via INTRAVENOUS

## 2019-09-21 MED ORDER — PROPOFOL 10 MG/ML IV BOLUS
INTRAVENOUS | Status: DC | PRN
  Administered 2019-09-21: 150 mg via INTRAVENOUS

## 2019-09-21 MED ORDER — OXYCODONE HCL 5 MG/5ML PO SOLN: 5.0000 mg | Freq: Once | ORAL | Status: AC | PRN

## 2019-09-21 MED ORDER — ONDANSETRON HCL 4 MG PO TABS: 4.0000 mg | ORAL_TABLET | Freq: Four times a day (QID) | ORAL | Status: DC | PRN

## 2019-09-21 MED ORDER — ONDANSETRON HCL 4 MG/2ML IJ SOLN: 4.0000 mg | Freq: Four times a day (QID) | INTRAMUSCULAR | Status: DC | PRN

## 2019-09-21 MED ORDER — LIDOCAINE HCL 1 % IJ SOLN
INTRAMUSCULAR | Status: DC | PRN
  Administered 2019-09-21: 20 mL

## 2019-09-21 MED ORDER — IODINE STRONG (LUGOLS) 5 % PO SOLN
ORAL | Status: DC | PRN
  Administered 2019-09-21: 0.1 mL

## 2019-09-21 MED ORDER — MIDAZOLAM HCL 2 MG/2ML IJ SOLN
INTRAMUSCULAR | Status: AC
  Filled 2019-09-21: qty 2

## 2019-09-21 SURGICAL SUPPLY — 35 items
BLADE SURG 15 STRL LF DISP TIS (BLADE) IMPLANT
BLADE SURG 15 STRL SS (BLADE)
CANISTER SUCT 1200ML W/VALVE (MISCELLANEOUS) IMPLANT
CANISTER SUCT 3000ML PPV (MISCELLANEOUS) IMPLANT
CATH ROBINSON RED A/P 16FR (CATHETERS) ×2 IMPLANT
COVER WAND RF STERILE (DRAPES) ×2 IMPLANT
DEPRESSOR TONGUE BLADE STERILE (MISCELLANEOUS) ×2 IMPLANT
DRSG TELFA 3X8 NADH (GAUZE/BANDAGES/DRESSINGS) IMPLANT
ELECT BALL LEEP 3MM BLK (ELECTRODE) IMPLANT
GLOVE BIO SURGEON STRL SZ 6 (GLOVE) ×2 IMPLANT
GOWN STRL REUS W/ TWL LRG LVL3 (GOWN DISPOSABLE) ×1 IMPLANT
GOWN STRL REUS W/TWL LRG LVL3 (GOWN DISPOSABLE) ×2
KIT TURNOVER CYSTO (KITS) ×2 IMPLANT
NEEDLE HYPO 25X1 1.5 SAFETY (NEEDLE) IMPLANT
NS IRRIG 500ML POUR BTL (IV SOLUTION) IMPLANT
PACK VAGINAL WOMENS (CUSTOM PROCEDURE TRAY) ×2 IMPLANT
PAD PREP 24X48 CUFFED NSTRL (MISCELLANEOUS) ×2 IMPLANT
PUNCH BIOPSY DERMAL 3 (INSTRUMENTS) IMPLANT
PUNCH BIOPSY DERMAL 3MM (INSTRUMENTS)
PUNCH BIOPSY DERMAL 4MM (INSTRUMENTS) IMPLANT
SUT VIC AB 0 CT1 36 (SUTURE) IMPLANT
SUT VIC AB 2-0 SH 27 (SUTURE)
SUT VIC AB 2-0 SH 27XBRD (SUTURE) IMPLANT
SUT VIC AB 3-0 PS2 18 (SUTURE)
SUT VIC AB 3-0 PS2 18XBRD (SUTURE) IMPLANT
SUT VIC AB 3-0 SH 27 (SUTURE)
SUT VIC AB 3-0 SH 27X BRD (SUTURE) IMPLANT
SUT VIC AB 4-0 PS2 18 (SUTURE) IMPLANT
SUT VICRYL 0 UR6 27IN ABS (SUTURE) IMPLANT
SWAB OB GYN 8IN STERILE 2PK (MISCELLANEOUS) ×4 IMPLANT
TOWEL OR 17X26 10 PK STRL BLUE (TOWEL DISPOSABLE) ×4 IMPLANT
TUBE CONNECTING 12X1/4 (SUCTIONS) IMPLANT
VACUUM HOSE 7/8X10 W/ WAND (MISCELLANEOUS) IMPLANT
VACUUM HOSE/TUBING 7/8INX6FT (MISCELLANEOUS) ×2 IMPLANT
WATER STERILE IRR 500ML POUR (IV SOLUTION) ×2 IMPLANT

## 2019-09-21 NOTE — Op Note (Signed)
OPERATIVE NOTE  PATIENT: [name] DATE: 09/21/19  Preop Diagnosis: vaginal intraepithelial neoplasia 3  Postoperative Diagnosis: same  Surgery: CO2 laser of the vagina  Surgeons:  Donaciano Eva, MD Assistant: none  Anesthesia: General   Estimated blood loss: scant  IVF:  272ml   Urine output: not recorded   Complications: None   Pathology: none   Operative findings: lugols nonstaining area measuring approximately 3x2cm in the right vaginal fornix extending predominantly anteriorally  Procedure: The patient was identified in the preoperative holding area. Informed consent was signed on the chart. Patient was seen history was reviewed and exam was performed.   The patient was then taken to the operating room and placed in the supine position with SCD hose on. General anesthesia was then induced without difficulty. She was then placed in the dorsolithotomy position. The perineum was prepped with Betadine. The vagina was prepped with Betadine. The patient was then draped after the prep was dried.  Timeout was performed the patient, procedure, antibiotic, allergy, and length of procedure. 5% acetic acid solution was applied to the vagina followed by lugols. The vaginal tissues were inspected for areas of lugols nonstaining or leukoplakia. The lesion was identified.The subcuticular tissues were infiltrated with 1% lidocaine.   The patient's surgical field was draped with wet towels. The staff and patient ensured laser-safe eyewear and masks were fitted. The laser was set to 12 watts continuous. The laser was tested for accuracy on a tongue depressor.  The laser was applied to the circumscribed area of the vagina that had been previously identified as lugols non-staining with an additional border beyond. The tissue was ablated to the desired depth and the eschar was removed with a moistened sponge. When the entire lesion had been ablated the procedure was  complete.  All instrument, suture, laparotomy, Ray-Tec, and needle counts were correct x2. The patient tolerated the procedure well and was taken recovery room in stable condition. This is Everitt Amber dictating an operative note on [name].  Donaciano Eva, MD

## 2019-09-21 NOTE — Interval H&P Note (Signed)
History and Physical Interval Note:  09/21/2019 11:24 AM  [name]  has presented today for surgery, with the diagnosis of VAGINAL DISPLASIA.  The various methods of treatment have been discussed with the patient and family. After consideration of risks, benefits and other options for treatment, the patient has consented to  Procedure(s): CO2 LASER APPLICATION OF THE VAGINA (N/A) as a surgical intervention.  The patient's history has been reviewed, patient examined, no change in status, stable for surgery.  I have reviewed the patient's chart and labs.  Questions were answered to the patient's satisfaction.     Thereasa Solo

## 2019-09-21 NOTE — Transfer of Care (Signed)
Immediate Anesthesia Transfer of Care Note  Patient: [name]  Procedure(s) Performed: CO2 LASER APPLICATION OF THE VAGINA (N/A )  Patient Location: PACU  Anesthesia Type:General  Level of Consciousness: awake, alert , oriented, drowsy and patient cooperative  Airway & Oxygen Therapy: Patient Spontanous Breathing and Patient connected to nasal cannula oxygen  Post-op Assessment: Report given to RN and Post -op Vital signs reviewed and stable  Post vital signs: Reviewed and stable  Last Vitals:  Vitals Value Taken Time  BP 129/77 09/21/19 1253  Temp    Pulse 79 09/21/19 1255  Resp 8 09/21/19 1254  SpO2 100 % 09/21/19 1255  Vitals shown include unvalidated device data.  Last Pain:  Vitals:   09/21/19 1123  TempSrc: Oral  PainSc: 0-No pain      Patients Stated Pain Goal: 3 (88/50/27 7412)  Complications: No complications documented.

## 2019-09-21 NOTE — Anesthesia Postprocedure Evaluation (Signed)
Anesthesia Post Note  Patient: [name]  Procedure(s) Performed: CO2 LASER APPLICATION OF THE VAGINA (N/A )     Patient location during evaluation: PACU Anesthesia Type: General Level of consciousness: awake and alert Pain management: pain level controlled Vital Signs Assessment: post-procedure vital signs reviewed and stable Respiratory status: spontaneous breathing, nonlabored ventilation, respiratory function stable and patient connected to nasal cannula oxygen Cardiovascular status: blood pressure returned to baseline and stable Postop Assessment: no apparent nausea or vomiting Anesthetic complications: no   No complications documented.  Last Vitals:  Vitals:   09/21/19 1123 09/21/19 1253  BP: 129/87 129/77  Pulse: 88 86  Resp: 14 (!) 8  Temp: 37.3 C (!) 36.3 C  SpO2: 100% 100%    Last Pain:  Vitals:   09/21/19 1253  TempSrc:   PainSc: 3                  Leoncio Hansen S

## 2019-09-21 NOTE — Discharge Instructions (Signed)
Vaginal Laser, Care After ACTIVITY  Rest as much as possible the first day after discharge.  You do not have weight restrictions on lifting  Avoid strenuous working out such as running or lifting weights for 24 hours  You can climb stairs and drive a car NUTRITION  You may resume your normal diet.  Drink 6 to 8 glasses of fluids a day.  Eat a healthy, balanced diet including portions of food from the meat (protein), milk, fruit, vegetable, and bread groups.  Your caregiver may recommend you take a multivitamin with iron.  ELIMINATION   If constipation occurs, drink more liquids, and add more fruits, vegetables, and bran to your diet. You may take a mild laxative, such as Milk of Magnesia, Metamucil, or a stool softener such as Colace, with permission from your caregiver.  HYGIENE  You may shower and wash your hair.  Avoid tub baths for 4 weeks  Do not add any bath oils or chemicals to your bath water, after you have permission to take baths.  Avoid placing anything in the vagina for 4 weeks.  It is normal to pass a brown-flecked discharge from the vagina for several weeks after your procedure.  HOME CARE INSTRUCTIONS   Take your temperature twice a day and record it, especially if you feel feverish or have chills.  Follow your caregiver's instructions about medicines, activity, and follow-up appointments after surgery.  Do not drink alcohol while taking pain medicine.  You may take over-the-counter medicine for pain, recommended by your caregiver.  If your pain is not relieved with medicine, call your caregiver.  Do not take aspirin because it can cause bleeding.  Do not douche or use tampons (use a nonperfumed sanitary pad).  Do not have sexual intercourse for 6 weeks postoperatively. Hugging, kissing, and playful sexual activity is fine with your caregiver's permission.  Take showers instead of baths, until your caregiver gives you permission to take  baths.  You may take a mild medicine for constipation, recommended by your caregiver. Bran foods and drinking a lot of fluids will help with constipation.  Make sure your family understands everything about your operation and recovery.  SEEK MEDICAL CARE IF:    You notice a foul smell coming from the vagina.  You have painful or bloody urination.  You develop nausea and vomiting.  You develop diarrhea.  You develop a rash.  You have a reaction or allergy from the medicine.  You feel dizzy or light-headed.  You need stronger pain medicine.   SEEK IMMEDIATE MEDICAL CARE IF:   You develop a temperature of 102 F (38.9 C) or higher.  You pass out.  You develop leg or chest pain.  You develop abdominal pain.  You develop shortness of breath.  You bleed heavier than a period (soaking through 2 or more pads per hour for 2 hours in a row).  You see pus in the wound area.  MAKE SURE YOU:   Understand these instructions.  Will watch your condition.  Will get help right away if you are not doing well or get worse. Document Released: 10/15/2003 Document Revised: 07/17/2013 Document Reviewed: 02/01/2009 [facility] Patient Information 2015 [place], [place]. This information is not intended to replace advice given to you by your health care provider. Make sure you discuss any questions you have with your health care provider.   Post Anesthesia Home Care Instructions  Activity: Get plenty of rest for the remainder of the day. A responsible individual must  stay with you for 24 hours following the procedure.  For the next 24 hours, DO NOT: -Drive a car -Paediatric nurse -Drink alcoholic beverages -Take any medication unless instructed by your physician -Make any legal decisions or sign important papers.  Meals: Start with liquid foods such as gelatin or soup. Progress to regular foods as tolerated. Avoid greasy, spicy, heavy foods. If nausea and/or vomiting occur, drink  only clear liquids until the nausea and/or vomiting subsides. Call your physician if vomiting continues.  Special Instructions/Symptoms: Your throat may feel dry or sore from the anesthesia or the breathing tube placed in your throat during surgery. If this causes discomfort, gargle with warm salt water. The discomfort should disappear within 24 hours.  If you had a scopolamine patch placed behind your ear for the management of post- operative nausea and/or vomiting:  1. The medication in the patch is effective for 72 hours, after which it should be removed.  Wrap patch in a tissue and discard in the trash. Wash hands thoroughly with soap and water. 2. You may remove the patch earlier than 72 hours if you experience unpleasant side effects which may include dry mouth, dizziness or visual disturbances. 3. Avoid touching the patch. Wash your hands with soap and water after contact with the patch.

## 2019-09-21 NOTE — Anesthesia Procedure Notes (Signed)
Procedure Name: LMA Insertion Date/Time: 09/21/2019 12:19 PM Performed by: Raenette Rover, CRNA Pre-anesthesia Checklist: Patient identified, Emergency Drugs available, Suction available and Patient being monitored Patient Re-evaluated:Patient Re-evaluated prior to induction Oxygen Delivery Method: Circle system utilized Preoxygenation: Pre-oxygenation with 100% oxygen Induction Type: IV induction LMA: LMA inserted LMA Size: 4.0 Number of attempts: 1 Placement Confirmation: breath sounds checked- equal and bilateral and positive ETCO2 Tube secured with: Tape Dental Injury: Teeth and Oropharynx as per pre-operative assessment

## 2019-09-21 NOTE — Anesthesia Preprocedure Evaluation (Signed)
Anesthesia Evaluation  Patient identified by MRN, date of birth, ID band Patient awake    Reviewed: Allergy & Precautions, NPO status , Patient's Chart, lab work & pertinent test results  Airway Mallampati: II  TM Distance: >3 FB Neck ROM: Full    Dental no notable dental hx.    Pulmonary neg pulmonary ROS, former smoker,    Pulmonary exam normal breath sounds clear to auscultation       Cardiovascular negative cardio ROS Normal cardiovascular exam Rhythm:Regular Rate:Normal     Neuro/Psych negative neurological ROS  negative psych ROS   GI/Hepatic negative GI ROS, Neg liver ROS,   Endo/Other  negative endocrine ROS  Renal/GU negative Renal ROS  negative genitourinary   Musculoskeletal  (+) Fibromyalgia -  Abdominal   Peds negative pediatric ROS (+)  Hematology negative hematology ROS (+)   Anesthesia Other Findings   Reproductive/Obstetrics negative OB ROS                             Anesthesia Physical Anesthesia Plan  ASA: II  Anesthesia Plan: General   Post-op Pain Management:    Induction: Intravenous  PONV Risk Score and Plan: 3 and Ondansetron, Dexamethasone and Treatment may vary due to age or medical condition  Airway Management Planned: LMA  Additional Equipment:   Intra-op Plan:   Post-operative Plan: Extubation in OR  Informed Consent: I have reviewed the patients History and Physical, chart, labs and discussed the procedure including the risks, benefits and alternatives for the proposed anesthesia with the patient or authorized representative who has indicated his/her understanding and acceptance.     Dental advisory given  Plan Discussed with: CRNA and Surgeon  Anesthesia Plan Comments:         Anesthesia Quick Evaluation

## 2019-09-22 ENCOUNTER — Encounter (HOSPITAL_BASED_OUTPATIENT_CLINIC_OR_DEPARTMENT_OTHER): Payer: Self-pay | Admitting: Gynecologic Oncology

## 2019-09-22 ENCOUNTER — Telehealth: Payer: Self-pay

## 2019-09-22 NOTE — Telephone Encounter (Signed)
[name] states she is doing very well. She is eating, drinking, and urinating well.She is only needing tylenol for the discomfort. Pt has a BM today. Afebrile. Pt aware of her post op appointment and the office number [phone] to call if she has any questions or concerns.

## 2019-09-25 DIAGNOSIS — Z13820 Encounter for screening for osteoporosis: Secondary | ICD-10-CM | POA: Diagnosis not present

## 2019-09-28 ENCOUNTER — Other Ambulatory Visit: Payer: Self-pay | Admitting: Physician Assistant

## 2019-09-28 NOTE — Telephone Encounter (Signed)
Last Visit:04/17/2019 telemedicine Next Visit:11/09/2019 UDS:01/03/2019 c/w Narc Agreement:01/03/2019  Last fill: 08/11/2019  Patient had surgery on 09/21/2019. Patient was given a prescription from the physician who performed the surgery. Patient states she would like to recover from surgery prior to updating UDS and narc agreement.   Okay to refilltramadol?

## 2019-09-29 ENCOUNTER — Other Ambulatory Visit: Payer: Self-pay | Admitting: Rheumatology

## 2019-09-29 NOTE — Telephone Encounter (Signed)
Last Visit:04/17/2019 telemedicine Next Visit:11/09/2019  Okay to refill per Dr. Estanislado Pandy

## 2019-10-20 ENCOUNTER — Encounter: Payer: Self-pay | Admitting: Gynecologic Oncology

## 2019-10-20 ENCOUNTER — Inpatient Hospital Stay: Payer: BC Managed Care – PPO | Attending: Gynecologic Oncology | Admitting: Gynecologic Oncology

## 2019-10-20 ENCOUNTER — Other Ambulatory Visit: Payer: Self-pay

## 2019-10-20 VITALS — BP 101/74 | HR 96 | Temp 99.4°F | Resp 18 | Ht 63.0 in | Wt 117.2 lb

## 2019-10-20 DIAGNOSIS — Z79899 Other long term (current) drug therapy: Secondary | ICD-10-CM | POA: Diagnosis not present

## 2019-10-20 DIAGNOSIS — N941 Unspecified dyspareunia: Secondary | ICD-10-CM | POA: Insufficient documentation

## 2019-10-20 DIAGNOSIS — R87810 Cervical high risk human papillomavirus (HPV) DNA test positive: Secondary | ICD-10-CM | POA: Insufficient documentation

## 2019-10-20 DIAGNOSIS — D072 Carcinoma in situ of vagina: Secondary | ICD-10-CM | POA: Diagnosis not present

## 2019-10-20 DIAGNOSIS — Z87891 Personal history of nicotine dependence: Secondary | ICD-10-CM | POA: Diagnosis not present

## 2019-10-20 DIAGNOSIS — Z90710 Acquired absence of both cervix and uterus: Secondary | ICD-10-CM | POA: Diagnosis not present

## 2019-10-20 NOTE — Progress Notes (Signed)
Followup Note: Gyn-Onc  Consult was requested by Dr. Matthew Saras for the evaluation of [name] 54 y.o. female  CC:  Chief Complaint  Patient presents with  . VAIN III (vaginal intraepithelial neoplasia grade III)    Assessment/Plan:  [name]  is a 54 y.o.  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  I am recommending 6 month follow-up pap smear with Dr Matthew Saras.  I would be happy to see her back for retreatment should recurrence develop and be confirmed on biopsy.   With respect to her chronic dyspareunia, this may become worse after treatment for her vaginal dysplasia.  I am recommending follow-up with a pelvic pain specialist such as Dr. Illene Bolus after she is completed recovery for her dysplasia.  HPI: [name] is a 54 year old P1 who was seen in consultation at the request of Dr. Matthew Saras for evaluation of VAIN 2-3.  The patient reports having history of abnormal Paps since age 79.  This required periodic cervical freezing or excisional procedures at age 89, later during pregnancy, and again menopause.  She underwent a hysterectomy via LAVH with Dr. Matthew Saras in 2024 pelvic organ prolapse.  Final pathology from that specimen confirmed HPV associated changes with koilocytotic atypia but no malignancy or dysplasia.  She underwent subsequent Pap test in March 2021 which revealed low-grade dysplasia positive for high-risk HPV.  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  Patient has a history of prolonged tobacco use with cessation at age 36.  She now uses vaporized nicotine.  She has a surgical history significant for cesarean  section, tubal ligation, appendectomy, cervical spine fusion, and hysterectomy.  Following her hysterectomy she had development of severe both superficial and deep dyspareunia.  This was not helped much with the addition of vaginal estrogen cream.  Her dyspareunia is so quality of life limiting that she no longer engages in intercourse with her husband which is extremely upsetting to her.  Interval Hx:  On 09/21/19 she underwent CO2 laser of the vagina. Intraoperative findings were significant for lugols non-staining tissues at the right vaginal fornix extending anteriorally and measuring approx 3x2cm.  Current Meds:  Outpatient Encounter Medications as of 10/20/2019  Medication Sig  . acetaminophen (TYLENOL) 325 MG tablet Take 650 mg by mouth every 6 (six) hours as needed.  Marland Kitchen atorvastatin (LIPITOR) 10 MG tablet Take 10 mg by mouth at bedtime.  . calcium carbonate (TUMS - DOSED IN MG ELEMENTAL CALCIUM) 500 MG chewable tablet Chew 2-4 tablets by mouth daily as needed for indigestion or heartburn.  . Ferrous Sulfate (IRON PO) Take 85 mg by mouth daily with lunch.  . Magnesium 400 MG TABS Take 400 mg by mouth 4 (four) times daily.  . Sodium Chloride-Sodium Bicarb (NETI POT SINUS WASH NA) Place 1 Dose into the nose daily as needed (congestion).  . topiramate (TOPAMAX) 25 MG tablet TAKE 2 TABLETS BY MOUTH AT BEDTIME  . traMADol (ULTRAM) 50 MG tablet TAKE 1-2 TABLETS BY MOUTH TWICE DAILY AS NEEDED.  . [DISCONTINUED] senna-docusate (SENOKOT-S) 8.6-50 MG tablet Take 2 tablets by mouth at bedtime. For AFTER surgery, do not take if having diarrhea  . [  DISCONTINUED] traMADol (ULTRAM) 50 MG tablet Take 1 tablet (50 mg total) by mouth every 6 (six) hours as needed for severe pain. For AFTER surgery only, do not take and drive   No facility-administered encounter medications on file as of 10/20/2019.    Allergy:  Allergies  Allergen Reactions  . Ibuprofen     Cannot take due to kidney function  . Lyrica  [Pregabalin]     Does not like    Social Hx:   Social History   Socioeconomic History  . Marital status: Married    Spouse name: Not on file  . Number of children: Not on file  . Years of education: Not on file  . Highest education level: Not on file  Occupational History  . Not on file  Tobacco Use  . Smoking status: Former Smoker    Years: 10.00    Types: E-cigarettes  . Smokeless tobacco: Never Used  . Tobacco comment: quit yrs ago  Vaping Use  . Vaping Use: Every day  . Substances: Nicotine  . Devices: Voopo  Substance and Sexual Activity  . Alcohol use: Not Currently  . Drug use: No  . Sexual activity: Not Currently  Other Topics Concern  . Not on file  Social History Narrative   Patient lives at home with her husband and son. She has her BS and she is not working.    Social Determinants of Health   Financial Resource Strain:   . Difficulty of Paying Living Expenses:   Food Insecurity:   . Worried About Charity fundraiser in the Last Year:   . Arboriculturist in the Last Year:   Transportation Needs:   . Film/video editor (Medical):   Marland Kitchen Lack of Transportation (Non-Medical):   Physical Activity:   . Days of Exercise per Week:   . Minutes of Exercise per Session:   Stress:   . Feeling of Stress :   Social Connections:   . Frequency of Communication with Friends and Family:   . Frequency of Social Gatherings with Friends and Family:   . Attends Religious Services:   . Active Member of Clubs or Organizations:   . Attends Archivist Meetings:   Marland Kitchen Marital Status:   Intimate Partner Violence:   . Fear of Current or Ex-Partner:   . Emotionally Abused:   Marland Kitchen Physically Abused:   . Sexually Abused:     Past Surgical Hx:  Past Surgical History:  Procedure Laterality Date  . ANTERIOR AND POSTERIOR REPAIR WITH SACROSPINOUS FIXATION N/A 08/01/2018   Procedure: ANTERIOR REPAIR;  Surgeon: Molli Posey, MD;  Location: [facility];   Service: Gynecology;  Laterality: N/A;  . APPENDECTOMY  age 11 or 61  . CERVICAL FUSION  yrs ago   C5 to C6 stiff neck  . CESAREAN SECTION    . CO2 LASER APPLICATION N/A 10/20/5782   Procedure: CO2 LASER APPLICATION OF THE VAGINA;  Surgeon: Everitt Amber, MD;  Location: [facility];  Service: Gynecology;  Laterality: N/A;  . HEMORRHOID SURGERY    . LAPAROSCOPIC VAGINAL HYSTERECTOMY WITH SALPINGECTOMY Bilateral 08/01/2018   Procedure: LAPAROSCOPIC ASSISTED VAGINAL HYSTERECTOMY WITH SALPINGECTOMY;  Surgeon: Molli Posey, MD;  Location: [facility];  Service: Gynecology;  Laterality: Bilateral;  need bed  . NASAL SEPTUM SURGERY  2020  . TUBAL LIGATION    . UTERINE POLYPS  2019    Past Medical Hx:  Past Medical History:  Diagnosis Date  . Anemia   . Esophageal spasm 2019  . Fibromyalgia   . Intervertebral disk disease   . Migraine   . Vaginal dysplasia     Past Gynecological History:  See HPI Patient's last menstrual period was 01/15/2018.  Family Hx:  Family History  Problem Relation Age of Onset  . Breast cancer Paternal Grandmother     Review of Systems:  Constitutional  Feels well,   ENT Normal appearing ears and nares bilaterally Skin/Breast  No rash, sores, jaundice, itching, dryness Cardiovascular  No chest pain, shortness of breath, or edema  Pulmonary  No cough or wheeze.  Gastro Intestinal  No nausea, vomitting, or diarrhoea. No bright red blood per rectum, no abdominal pain, change in bowel movement, or constipation.  Genito Urinary  No frequency, urgency, dysuria, +vaginal pain Musculo Skeletal  No myalgia, arthralgia, joint swelling or pain  Neurologic  No weakness, numbness, change in gait,  Psychology  No depression, anxiety, insomnia.   Vitals:  Blood pressure 101/74, pulse 96, temperature 99.4 F (37.4 C), temperature source Oral, resp. rate 18, height 5\' 3"  (1.6 m), weight 117 lb 3.2 oz (53.2 kg), last menstrual  period 01/15/2018, SpO2 98 %.  Physical Exam: WD in NAD Neck  Supple NROM, without any enlargements.  Lymph Node Survey No cervical supraclavicular or inguinal adenopathy Cardiovascular  Pulse normal rate, regularity and rhythm. S1 and S2 normal.  Lungs  Clear to auscultation bilateraly, without wheezes/crackles/rhonchi. Good air movement.  Skin  No rash/lesions/breakdown  Psychiatry  Alert and oriented to person, place, and time  Abdomen  Normoactive bowel sounds, abdomen soft, non-tender and thin without evidence of hernia.  Back No CVA tenderness Genito Urinary  Vulva/vagina: Normal external female genitalia.   No lesions. No discharge or bleeding.  Bladder/urethra:  No lesions or masses, well supported bladder  Vagina: well healed tissues, no lesions. Rectal  deferred Extremities  No bilateral cyanosis, clubbing or edema.  Thereasa Solo, MD  10/20/2019, 2:29 PM

## 2019-10-20 NOTE — Patient Instructions (Signed)
Dr Denman George recommends a repeat pap test with Dr Matthew Saras in 6 months.  Dr Denman George will see you back for treatment if you need further treatment in the future.  Your vaginal tissues have completely healed and no restrictions apply.

## 2019-10-26 NOTE — Progress Notes (Signed)
Office Visit Note  Patient: [name]             Date of Birth: [birth year]           MRN: [id number]             PCP: Leighton Ruff, MD Referring: Leighton Ruff, MD Visit Date: 11/09/2019 Occupation: @GUAROCC @  Subjective:  Other (right shoulder pain )   History of Present Illness: [name] is a 54 y.o. female with history of fibromyalgia osteoarthritis and degenerative disc disease.  She states about 6 months ago she was working on an object which was higher than her height.  She had repeated motion of her right arm.  She states since then her shoulder has been very painful.  She is having difficulty lifting her right arm.  She is also having some generalized pain recently.  She states she did not increase her tramadol dose.  She had some leftover muscle relaxers that she took which may have helped.  She states she has been having problems with cognition, concentration and orientation.  She is sometimes disoriented.    Activities of Daily Living:  Patient reports morning stiffness for 24  hours.   Patient Reports nocturnal pain.  Difficulty dressing/grooming: Reports Difficulty climbing stairs: Reports Difficulty getting out of chair: Reports Difficulty using hands for taps, buttons, cutlery, and/or writing: Reports  Review of Systems  Constitutional: Positive for fatigue.  HENT: Positive for mouth sores, mouth dryness and nose dryness.   Eyes: Positive for dryness.  Respiratory: Negative for shortness of breath and difficulty breathing.   Cardiovascular: Negative for chest pain and palpitations.  Gastrointestinal: Positive for constipation. Negative for blood in stool and diarrhea.  Endocrine: Positive for increased urination.  Genitourinary: Negative for difficulty urinating.  Musculoskeletal: Positive for arthralgias, joint pain, myalgias, morning stiffness, muscle tenderness and myalgias. Negative for joint swelling.  Skin: Negative for color change, rash and  redness.  Allergic/Immunologic: Negative for susceptible to infections.  Neurological: Positive for numbness, headaches, memory loss and weakness. Negative for dizziness.  Hematological: Negative for bruising/bleeding tendency.  Psychiatric/Behavioral: Positive for confusion and sleep disturbance.    PMFS History:  Patient Active Problem List   Diagnosis Date Noted  . 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 Pelvic relaxation 08/01/2018  . Prolapse of female pelvic organs 08/01/2018  . Other fatigue 10/01/2016  . Primary insomnia 10/01/2016  . History of migraine 10/01/2016  . DDD (degenerative disc disease), lumbar 04/08/2016  . DDD (degenerative disc disease), cervical 04/08/2016  . Osteoarthritis, hand 04/08/2016  . Fibromyalgia   . Intervertebral disk disease   . HA (headache)     Past Medical History:  Diagnosis Date  . Anemia   . Esophageal spasm 2019  . Fibromyalgia   . Intervertebral disk disease   . Migraine   . Vaginal dysplasia     Family History  Problem Relation Age of Onset  . Alzheimer's disease Mother   . Dementia Mother   . Breast cancer Paternal Grandmother    Past Surgical History:  Procedure Laterality Date  . ANTERIOR AND POSTERIOR REPAIR WITH SACROSPINOUS FIXATION N/A 08/01/2018   Procedure: ANTERIOR REPAIR;  Surgeon: Molli Posey, MD;  Location: [facility];  Service: Gynecology;  Laterality: N/A;  . APPENDECTOMY  age 18 or 56  . CERVICAL FUSION  yrs ago   C5 to C6 stiff neck  . CESAREAN SECTION    . CO2 LASER APPLICATION  N/A 09/21/2019   Procedure: CO2 LASER APPLICATION OF THE VAGINA;  Surgeon: Everitt Amber, MD;  Location: [facility];  Service: Gynecology;  Laterality: N/A;  . HEMORRHOID SURGERY    . LAPAROSCOPIC VAGINAL HYSTERECTOMY WITH SALPINGECTOMY Bilateral 08/01/2018   Procedure: LAPAROSCOPIC ASSISTED VAGINAL HYSTERECTOMY WITH SALPINGECTOMY;  Surgeon: Molli Posey, MD;   Location: [facility];  Service: Gynecology;  Laterality: Bilateral;  need bed  . NASAL SEPTUM SURGERY  2020  . TUBAL LIGATION    . UTERINE POLYPS  2019   Social History   Social History Narrative   Patient lives at home with her husband and son. She has her BS and she is not working.    Immunization History  Administered Date(s) Administered  . PFIZER SARS-COV-2 Vaccination 06/07/2019, 06/28/2019     Objective: Vital Signs: BP 135/78 (BP Location: Left Arm, Patient Position: Sitting, Cuff Size: Normal)   Pulse (!) 101   Resp 14   Ht 5\' 3"  (1.6 m)   Wt 117 lb (53.1 kg)   LMP 01/15/2018   BMI 20.73 kg/m    Physical Exam Vitals and nursing note reviewed.  Constitutional:      Appearance: She is well-developed.  HENT:     Head: Normocephalic and atraumatic.  Eyes:     Conjunctiva/sclera: Conjunctivae normal.  Cardiovascular:     Rate and Rhythm: Normal rate and regular rhythm.     Heart sounds: Normal heart sounds.  Pulmonary:     Effort: Pulmonary effort is normal.     Breath sounds: Normal breath sounds.  Abdominal:     General: Bowel sounds are normal.     Palpations: Abdomen is soft.  Musculoskeletal:     Cervical back: Normal range of motion.  Lymphadenopathy:     Cervical: No cervical adenopathy.  Skin:    General: Skin is warm and dry.     Capillary Refill: Capillary refill takes less than 2 seconds.  Neurological:     Mental Status: She is alert and oriented to person, place, and time.  Psychiatric:        Behavior: Behavior normal.      Musculoskeletal Exam: C-spine was in good range of motion.  She had painful limited range of motion of her right shoulder to 90 degrees.  She had a lot of discomfort with internal rotation.  Left shoulder joint was in good range of motion.  Elbow joints, wrist joints, MCPs with good range of motion.  She has DIP and PIP thickening in her hands.  Hip joints, knee joints, ankles, MTPs and PIPs with good range  of motion with no synovitis.  CDAI Exam: CDAI Score: -- Patient Global: --; Provider Global: -- Swollen: --; Tender: -- Joint Exam 11/09/2019   No joint exam has been documented for this visit   There is currently no information documented on the homunculus. Go to the Rheumatology activity and complete the homunculus joint exam.  Investigation: No additional findings.  Imaging: No results found.  Recent Labs: Lab Results  Component Value Date   WBC 4.8 09/19/2019   HGB 12.2 09/19/2019   PLT 288 09/19/2019   NA 139 09/19/2019   K 4.5 09/19/2019   CL 105 09/19/2019   CO2 27 09/19/2019   GLUCOSE 101 (H) 09/19/2019   BUN 16 09/19/2019   CREATININE 0.88 09/19/2019   CALCIUM 9.8 09/19/2019   GFRAA >60 09/19/2019    Speciality Comments: No specialty comments available.  Procedures:  Large Joint  Inj: R glenohumeral on 11/09/2019 11:04 AM Indications: pain Details: 27 G 1.5 in needle, posterior approach  Arthrogram: No  Medications: 40 mg triamcinolone acetonide 40 MG/ML; 1.5 mL lidocaine 1 % Aspirate: 0 mL Outcome: tolerated well, no immediate complications Procedure, treatment alternatives, risks and benefits explained, specific risks discussed. Consent was given by the patient. Immediately prior to procedure a time out was called to verify the correct patient, procedure, equipment, support staff and site/side marked as required. Patient was prepped and draped in the usual sterile fashion.     Allergies: Ibuprofen and Lyrica [pregabalin]   Assessment / Plan:     Visit Diagnoses: Fibromyalgia-she continues to have some generalized pain and discomfort from fibromyalgia.  Need for regular exercise and stretching was discussed.  Pain management - Tramadol 50 mg 1.5 tablets during the day and 1 tablet at bedtime for pain relief. UDS & narcotic agreement: Today.  We had detailed discussion regarding tapering tramadol.  Patient is in agreement.  She will decrease tramadol by  1 tablet p.o. weekly.  If she has difficulty tapering tramadol that I will refer her to pain management.  Medication monitoring encounter - Plan: DRUG MONITOR, TRAMADOL,QN, URINE, DRUG MONITOR, PANEL 5, W/CONF, URINE  Chronic right shoulder pain -she has severe pain and discomfort in her right shoulder joint.  She states pain is started after working with her right arm about 6 months ago.  She wants a cortisone injection to her right shoulder.  Plan: XR Shoulder Right.  X-ray of the shoulder joint was unremarkable.  Per her request right shoulder joint was injected with cortisone as described above.  She tolerated the procedure well.  I offered physical therapy which she declined.  I will give her a handout on shoulder exercises.  Advised her to contact us in case her symptoms persist.  Primary osteoarthritis of both hands-joint protection muscle strengthening was discussed.  DDD (degenerative disc disease), cervical - She had surgery in the past from her cervical spine by Dr. Marcial Pacas.  DDD (degenerative disc disease), lumbar - She is followed by Dr. Louanne Skye.  Chronic right SI joint pain - She had a right SI joint cortisone injection performed by Dr. Ernestina Patches on 12/12/18.  Primary insomnia-good sleep hygiene was discussed.  Other fatigue  History of migraine - She would like to try to discontinue Topamax.  She was advised to reduce Topamax by half tablet every 2 weeks and to not discontinue abruptly.  She will discontinue Topamax as discussed.  Osteopenia of multiple sites - According to patient she had recent DEXA which was consistent with osteopenia.  I have advised patient to bring her DEXA results at the next visit.  Memory loss-there is history of Alzheimer's and her mother and father.  She has been having memory loss, poor concentration and cognition issues.  She was very tearful throughout the conversation.  She plans to see a neurologist soon.  Patient is fully immunized.  Use of mask,  social distancing and hand hygiene was emphasized.  She will get booster when it is available to her.   Orders: Orders Placed This Encounter  Procedures  . XR Shoulder Right  . DRUG MONITOR, TRAMADOL,QN, URINE  . DRUG MONITOR, PANEL 5, W/CONF, URINE   No orders of the defined types were placed in this encounter.     Follow-Up Instructions: Return in about 6 months (around 05/11/2020) for Osteoarthritis, FMS.   Bo Merino, MD  Note - This record has been created using Dragon  software.  Chart creation errors have been sought, but may not always  have been located. Such creation errors do not reflect on  the standard of medical care.

## 2019-11-09 ENCOUNTER — Ambulatory Visit: Payer: BC Managed Care – PPO | Admitting: Rheumatology

## 2019-11-09 ENCOUNTER — Encounter: Payer: Self-pay | Admitting: Physician Assistant

## 2019-11-09 ENCOUNTER — Other Ambulatory Visit: Payer: Self-pay

## 2019-11-09 ENCOUNTER — Ambulatory Visit: Payer: Self-pay

## 2019-11-09 VITALS — BP 135/78 | HR 101 | Resp 14 | Ht 63.0 in | Wt 117.0 lb

## 2019-11-09 DIAGNOSIS — M797 Fibromyalgia: Secondary | ICD-10-CM

## 2019-11-09 DIAGNOSIS — G8929 Other chronic pain: Secondary | ICD-10-CM | POA: Diagnosis not present

## 2019-11-09 DIAGNOSIS — Z8669 Personal history of other diseases of the nervous system and sense organs: Secondary | ICD-10-CM

## 2019-11-09 DIAGNOSIS — M19042 Primary osteoarthritis, left hand: Secondary | ICD-10-CM

## 2019-11-09 DIAGNOSIS — R413 Other amnesia: Secondary | ICD-10-CM

## 2019-11-09 DIAGNOSIS — M8589 Other specified disorders of bone density and structure, multiple sites: Secondary | ICD-10-CM

## 2019-11-09 DIAGNOSIS — Z5181 Encounter for therapeutic drug level monitoring: Secondary | ICD-10-CM | POA: Diagnosis not present

## 2019-11-09 DIAGNOSIS — M25511 Pain in right shoulder: Secondary | ICD-10-CM

## 2019-11-09 DIAGNOSIS — R52 Pain, unspecified: Secondary | ICD-10-CM | POA: Diagnosis not present

## 2019-11-09 DIAGNOSIS — M5136 Other intervertebral disc degeneration, lumbar region: Secondary | ICD-10-CM

## 2019-11-09 DIAGNOSIS — M5030 Other cervical disc degeneration, unspecified cervical region: Secondary | ICD-10-CM

## 2019-11-09 DIAGNOSIS — M19041 Primary osteoarthritis, right hand: Secondary | ICD-10-CM

## 2019-11-09 DIAGNOSIS — F5101 Primary insomnia: Secondary | ICD-10-CM

## 2019-11-09 DIAGNOSIS — R5383 Other fatigue: Secondary | ICD-10-CM

## 2019-11-09 DIAGNOSIS — M533 Sacrococcygeal disorders, not elsewhere classified: Secondary | ICD-10-CM

## 2019-11-09 DIAGNOSIS — M7061 Trochanteric bursitis, right hip: Secondary | ICD-10-CM

## 2019-11-09 MED ORDER — LIDOCAINE HCL 1 % IJ SOLN
1.5000 mL | INTRAMUSCULAR | Status: AC | PRN
  Administered 2019-11-09: 1.5 mL

## 2019-11-09 MED ORDER — TRIAMCINOLONE ACETONIDE 40 MG/ML IJ SUSP
40.0000 mg | INTRAMUSCULAR | Status: AC | PRN
  Administered 2019-11-09: 40 mg via INTRA_ARTICULAR

## 2019-11-09 NOTE — Patient Instructions (Addendum)
Decrease Topamax by half a tablet every 2 weeks and then discontinue.  Decrease tramadol by 1 tablet every week and then discontinue.    Shoulder Exercises Ask your health care provider which exercises are safe for you. Do exercises exactly as told by your health care provider and adjust them as directed. It is normal to feel mild stretching, pulling, tightness, or discomfort as you do these exercises. Stop right away if you feel sudden pain or your pain gets worse. Do not begin these exercises until told by your health care provider. Stretching exercises External rotation and abduction This exercise is sometimes called corner stretch. This exercise rotates your arm outward (external rotation) and moves your arm out from your body (abduction). 1. Stand in a doorway with one of your feet slightly in front of the other. This is called a staggered stance. If you cannot reach your forearms to the door frame, stand facing a corner of a room. 2. Choose one of the following positions as told by your health care provider: ? Place your hands and forearms on the door frame above your head. ? Place your hands and forearms on the door frame at the height of your head. ? Place your hands on the door frame at the height of your elbows. 3. Slowly move your weight onto your front foot until you feel a stretch across your chest and in the front of your shoulders. Keep your head and chest upright and keep your abdominal muscles tight. 4. Hold for __________ seconds. 5. To release the stretch, shift your weight to your back foot. Repeat __________ times. Complete this exercise __________ times a day. Extension, standing 1. Stand and hold a broomstick, a cane, or a similar object behind your back. ? Your hands should be a little wider than shoulder width apart. ? Your palms should face away from your back. 2. Keeping your elbows straight and your shoulder muscles relaxed, move the stick away from your body until  you feel a stretch in your shoulders (extension). ? Avoid shrugging your shoulders while you move the stick. Keep your shoulder blades tucked down toward the middle of your back. 3. Hold for __________ seconds. 4. Slowly return to the starting position. Repeat __________ times. Complete this exercise __________ times a day. Range-of-motion exercises Pendulum  1. Stand near a wall or a surface that you can hold onto for balance. 2. Bend at the waist and let your left / right arm hang straight down. Use your other arm to support you. Keep your back straight and do not lock your knees. 3. Relax your left / right arm and shoulder muscles, and move your hips and your trunk so your left / right arm swings freely. Your arm should swing because of the motion of your body, not because you are using your arm or shoulder muscles. 4. Keep moving your hips and trunk so your arm swings in the following directions, as told by your health care provider: ? Side to side. ? Forward and backward. ? In clockwise and counterclockwise circles. 5. Continue each motion for __________ seconds, or for as long as told by your health care provider. 6. Slowly return to the starting position. Repeat __________ times. Complete this exercise __________ times a day. Shoulder flexion, standing  1. Stand and hold a broomstick, a cane, or a similar object. Place your hands a little more than shoulder width apart on the object. Your left / right hand should be palm up, and  your other hand should be palm down. 2. Keep your elbow straight and your shoulder muscles relaxed. Push the stick up with your healthy arm to raise your left / right arm in front of your body, and then over your head until you feel a stretch in your shoulder (flexion). ? Avoid shrugging your shoulder while you raise your arm. Keep your shoulder blade tucked down toward the middle of your back. 3. Hold for __________ seconds. 4. Slowly return to the starting  position. Repeat __________ times. Complete this exercise __________ times a day. Shoulder abduction, standing 1. Stand and hold a broomstick, a cane, or a similar object. Place your hands a little more than shoulder width apart on the object. Your left / right hand should be palm up, and your other hand should be palm down. 2. Keep your elbow straight and your shoulder muscles relaxed. Push the object across your body toward your left / right side. Raise your left / right arm to the side of your body (abduction) until you feel a stretch in your shoulder. ? Do not raise your arm above shoulder height unless your health care provider tells you to do that. ? If directed, raise your arm over your head. ? Avoid shrugging your shoulder while you raise your arm. Keep your shoulder blade tucked down toward the middle of your back. 3. Hold for __________ seconds. 4. Slowly return to the starting position. Repeat __________ times. Complete this exercise __________ times a day. Internal rotation  1. Place your left / right hand behind your back, palm up. 2. Use your other hand to dangle an exercise band, a towel, or a similar object over your shoulder. Grasp the band with your left / right hand so you are holding on to both ends. 3. Gently pull up on the band until you feel a stretch in the front of your left / right shoulder. The movement of your arm toward the center of your body is called internal rotation. ? Avoid shrugging your shoulder while you raise your arm. Keep your shoulder blade tucked down toward the middle of your back. 4. Hold for __________ seconds. 5. Release the stretch by letting go of the band and lowering your hands. Repeat __________ times. Complete this exercise __________ times a day. Strengthening exercises External rotation  1. Sit in a stable chair without armrests. 2. Secure an exercise band to a stable object at elbow height on your left / right side. 3. Place a soft  object, such as a folded towel or a small pillow, between your left / right upper arm and your body to move your elbow about 4 inches (10 cm) away from your side. 4. Hold the end of the exercise band so it is tight and there is no slack. 5. Keeping your elbow pressed against the soft object, slowly move your forearm out, away from your abdomen (external rotation). Keep your body steady so only your forearm moves. 6. Hold for __________ seconds. 7. Slowly return to the starting position. Repeat __________ times. Complete this exercise __________ times a day. Shoulder abduction  1. Sit in a stable chair without armrests, or stand up. 2. Hold a __________ weight in your left / right hand, or hold an exercise band with both hands. 3. Start with your arms straight down and your left / right palm facing in, toward your body. 4. Slowly lift your left / right hand out to your side (abduction). Do not lift your hand  above shoulder height unless your health care provider tells you that this is safe. ? Keep your arms straight. ? Avoid shrugging your shoulder while you do this movement. Keep your shoulder blade tucked down toward the middle of your back. 5. Hold for __________ seconds. 6. Slowly lower your arm, and return to the starting position. Repeat __________ times. Complete this exercise __________ times a day. Shoulder extension 1. Sit in a stable chair without armrests, or stand up. 2. Secure an exercise band to a stable object in front of you so it is at shoulder height. 3. Hold one end of the exercise band in each hand. Your palms should face each other. 4. Straighten your elbows and lift your hands up to shoulder height. 5. Step back, away from the secured end of the exercise band, until the band is tight and there is no slack. 6. Squeeze your shoulder blades together as you pull your hands down to the sides of your thighs (extension). Stop when your hands are straight down by your sides. Do  not let your hands go behind your body. 7. Hold for __________ seconds. 8. Slowly return to the starting position. Repeat __________ times. Complete this exercise __________ times a day. Shoulder row 1. Sit in a stable chair without armrests, or stand up. 2. Secure an exercise band to a stable object in front of you so it is at waist height. 3. Hold one end of the exercise band in each hand. Position your palms so that your thumbs are facing the ceiling (neutral position). 4. Bend each of your elbows to a 90-degree angle (right angle) and keep your upper arms at your sides. 5. Step back until the band is tight and there is no slack. 6. Slowly pull your elbows back behind you. 7. Hold for __________ seconds. 8. Slowly return to the starting position. Repeat __________ times. Complete this exercise __________ times a day. Shoulder press-ups  1. Sit in a stable chair that has armrests. Sit upright, with your feet flat on the floor. 2. Put your hands on the armrests so your elbows are bent and your fingers are pointing forward. Your hands should be about even with the sides of your body. 3. Push down on the armrests and use your arms to lift yourself off the chair. Straighten your elbows and lift yourself up as much as you comfortably can. ? Move your shoulder blades down, and avoid letting your shoulders move up toward your ears. ? Keep your feet on the ground. As you get stronger, your feet should support less of your body weight as you lift yourself up. 4. Hold for __________ seconds. 5. Slowly lower yourself back into the chair. Repeat __________ times. Complete this exercise __________ times a day. Wall push-ups  1. Stand so you are facing a stable wall. Your feet should be about one arm-length away from the wall. 2. Lean forward and place your palms on the wall at shoulder height. 3. Keep your feet flat on the floor as you bend your elbows and lean forward toward the wall. 4. Hold for  __________ seconds. 5. Straighten your elbows to push yourself back to the starting position. Repeat __________ times. Complete this exercise __________ times a day. This information is not intended to replace advice given to you by your health care provider. Make sure you discuss any questions you have with your health care provider. Document Revised: 06/24/2018 Document Reviewed: 04/01/2018 Elsevier Patient Education  [place].

## 2019-11-11 LAB — DRUG MONITOR, PANEL 5, W/CONF, URINE
Amphetamines: NEGATIVE ng/mL (ref <500)
Barbiturates: NEGATIVE ng/mL (ref <300)
Benzodiazepines: NEGATIVE ng/mL (ref <100)
Cocaine Metabolite: NEGATIVE ng/mL (ref <150)
Creatinine: 28.5 mg/dL
Marijuana Metabolite: NEGATIVE ng/mL (ref <20)
Methadone Metabolite: NEGATIVE ng/mL (ref <100)
Opiates: NEGATIVE ng/mL (ref <100)
Oxidant: NEGATIVE ug/mL
Oxycodone: NEGATIVE ng/mL (ref <100)
pH: 7 (ref 4.5–9.0)

## 2019-11-11 LAB — DRUG MONITOR, TRAMADOL,QN, URINE
Desmethyltramadol: 608 ng/mL — ABNORMAL HIGH (ref <100)
Tramadol: 3788 ng/mL — ABNORMAL HIGH (ref <100)

## 2019-11-11 LAB — DM TEMPLATE

## 2019-11-11 NOTE — Progress Notes (Signed)
C/w tt

## 2019-12-11 ENCOUNTER — Telehealth: Payer: Self-pay | Admitting: Physical Medicine and Rehabilitation

## 2019-12-11 NOTE — Telephone Encounter (Signed)
Patient called requesting a call back. Patient states she need an injection in  Her right hip. Also patient states she has been having pains in her right shoulder and would like an injection in the shoulder if possible. Please call patient to set appt. Patient phone number is [phone].

## 2019-12-11 NOTE — Telephone Encounter (Signed)
Repeat ok, 30 minutes difficult potential

## 2019-12-11 NOTE — Telephone Encounter (Signed)
Right greater trochanteric bursa and right SI joint injections on 12/12/18. No shoulder injections with you. Please advise.

## 2019-12-12 NOTE — Telephone Encounter (Signed)
Scheduled for 10/13 at 1430.

## 2019-12-27 ENCOUNTER — Other Ambulatory Visit: Payer: Self-pay

## 2019-12-27 ENCOUNTER — Ambulatory Visit: Payer: Self-pay

## 2019-12-27 ENCOUNTER — Encounter: Payer: Self-pay | Admitting: Physical Medicine and Rehabilitation

## 2019-12-27 ENCOUNTER — Ambulatory Visit: Payer: BC Managed Care – PPO | Admitting: Physical Medicine and Rehabilitation

## 2019-12-27 DIAGNOSIS — M7061 Trochanteric bursitis, right hip: Secondary | ICD-10-CM

## 2019-12-27 DIAGNOSIS — M545 Low back pain, unspecified: Secondary | ICD-10-CM

## 2019-12-27 DIAGNOSIS — G8929 Other chronic pain: Secondary | ICD-10-CM

## 2019-12-27 DIAGNOSIS — M25551 Pain in right hip: Secondary | ICD-10-CM

## 2019-12-27 DIAGNOSIS — M461 Sacroiliitis, not elsewhere classified: Secondary | ICD-10-CM

## 2019-12-27 DIAGNOSIS — M25552 Pain in left hip: Secondary | ICD-10-CM

## 2019-12-27 NOTE — Progress Notes (Signed)
[name] - 54 y.o. female MRN [id number]  Date of birth: Sep 11, 1965  Office Visit Note: Visit Date: 12/27/2019 PCP: Leighton Ruff, MD Referred by: Leighton Ruff, MD  Subjective: Chief Complaint  Patient presents with  . Right Hip - Pain  . Lower Back - Pain  . Right Shoulder - Pain  . Left Shoulder - Pain   HPI:  [name] is a 54 y.o. female who comes in today For evaluation management of chronic worsening bilateral low back and buttock pain right more than left but bilateral.  Her history is well-known to [place].  She is followed by both Dr. Basil Dess and Dr. Cy Blamer.  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  She does have pain really all over.  Last time I saw her was in September of last year and we completed a right sacroiliac joint injection and greater trochanteric bursa injection with good relief for quite some time.  She has tried and failed all manner of conservative and nonconservative care at this point.  Today her pain is over the PSIS bilaterally and we will complete diagnostic bilateral sacroiliac joint injections.  ROS Otherwise per HPI.  Assessment & Plan: Visit Diagnoses:  1. Sacroiliitis ([place])   2. Chronic bilateral low back pain without sciatica   3. Pain in left hip   4. Pain in right hip     Plan: No additional findings.   Meds & Orders: No orders of the defined types were placed in this encounter.   Orders Placed This Encounter  Procedures  . Sacroiliac Joint Inj  . Large Joint Inj: R greater trochanter  . XR C-ARM NO REPORT    Follow-up: No follow-ups on file.   Procedures: Sacroiliac Joint Inj (Bilateral) on 12/27/2019 2:32 PM Indications: pain and diagnostic evaluation Details: 22 G 3.5 in needle, fluoroscopy-guided posterior approach Medications (Right): 40 mg methylPREDNISolone acetate 40 MG/ML; 2 mL bupivacaine 0.5 % Medications (Left): 40 mg methylPREDNISolone acetate 40 MG/ML; 2 mL  bupivacaine 0.5 % Outcome: tolerated well, no immediate complications  There was excellent flow of contrast producing a partial arthrogram of the sacroiliac joints.  Right arthrogram showing good flow throughout the joint with a left arthrogram more in the posterior joint. Procedure, treatment alternatives, risks and benefits explained, specific risks discussed. Consent was given by the patient. Immediately prior to procedure a time out was called to verify the correct patient, procedure, equipment, support staff and site/side marked as required. Patient was prepped and draped in the usual sterile fashion.      No notes on file   Clinical History: MRI LUMBAR SPINE WITHOUT CONTRAST  TECHNIQUE: Multiplanar, multisequence MR imaging of the lumbar spine was performed. No intravenous contrast was administered.  COMPARISON:  Radiography 04/19/2018.  MRI 02/03/2010.  FINDINGS: Segmentation:  5 lumbar type vertebral bodies.  Alignment:  Normal  Vertebrae:  Normal  Conus medullaris and cauda equina: Conus extends to the L2 level. Conus and cauda equina appear normal.  Paraspinal and other soft tissues: Negative  Disc levels:  No abnormality at T12-L1, L1-2 or L2-3.  L3-4: Mild desiccation and bulging of the disc. Mild facet and ligamentous hypertrophy. No compressive stenosis.  L4-5 and L5-S1: Normal.  No disc or facet pathology.  No stenosis.  Compared to the study of 2011, the findings are quite similar. There is not been any significant progression of degenerative change at L3-4.  IMPRESSION: Very similar appearance to the study  of 2011. Mild degenerative change at L3-4 with mild desiccation and bulging of the disc. Mild facet hypertrophy. No apparent compressive stenosis. The other levels are normal.   Electronically Signed   By: Nelson Chimes M.D.   On: 04/29/2018 16:33     Objective:  VS:  HT:    WT:   BMI:     BP:   HR: bpm  TEMP: ( )  RESP:    Physical Exam   Imaging: No results found.

## 2019-12-27 NOTE — Progress Notes (Signed)
Pt state both hip, lower back and shoulders pain. Pt state walking and sitting for a long period of time makes the pain worse. Pt state heating pad and pain meds helps ease the pain.  Numeric Pain Rating Scale and Functional Assessment Average Pain 3   In the last MONTH (on 0-10 scale) has pain interfered with the following?  1. General activity like being  able to carry out your everyday physical activities such as walking, climbing stairs, carrying groceries, or moving a chair?  Rating(9)

## 2020-01-08 DIAGNOSIS — Z23 Encounter for immunization: Secondary | ICD-10-CM | POA: Diagnosis not present

## 2020-01-08 MED ORDER — METHYLPREDNISOLONE ACETATE 40 MG/ML IJ SUSP
40.0000 mg | INTRAMUSCULAR | Status: AC | PRN
  Administered 2019-12-27: 40 mg via INTRA_ARTICULAR

## 2020-01-08 MED ORDER — BUPIVACAINE HCL 0.5 % IJ SOLN
2.0000 mL | INTRAMUSCULAR | Status: AC | PRN
  Administered 2019-12-27: 2 mL via INTRA_ARTICULAR

## 2020-01-26 DIAGNOSIS — R509 Fever, unspecified: Secondary | ICD-10-CM | POA: Diagnosis not present

## 2020-01-26 DIAGNOSIS — R0981 Nasal congestion: Secondary | ICD-10-CM | POA: Diagnosis not present

## 2020-01-26 DIAGNOSIS — E782 Mixed hyperlipidemia: Secondary | ICD-10-CM | POA: Diagnosis not present

## 2020-01-26 DIAGNOSIS — Z03818 Encounter for observation for suspected exposure to other biological agents ruled out: Secondary | ICD-10-CM | POA: Diagnosis not present

## 2020-02-06 DIAGNOSIS — R7301 Impaired fasting glucose: Secondary | ICD-10-CM | POA: Diagnosis not present

## 2020-02-06 DIAGNOSIS — E782 Mixed hyperlipidemia: Secondary | ICD-10-CM | POA: Diagnosis not present

## 2020-04-22 IMAGING — MR MR LUMBAR SPINE W/O CM
4 of 5 series · 19 of 48 positions shown · non-contrast
Comparison: Radiography 04/19/2018.  MRI 02/03/2010.

CLINICAL DATA: Back pain with bilateral leg pain and right buttock
pain.

EXAM:
MRI LUMBAR SPINE WITHOUT CONTRAST
TECHNIQUE: Multiplanar, multisequence MR imaging of the lumbar spine was
performed. No intravenous contrast was administered.

[Series 3: T1 · sagittal · 4.0mm · 0.51mm/px · 3 of 12 slices shown (1 of 2)]
[im 3/12]
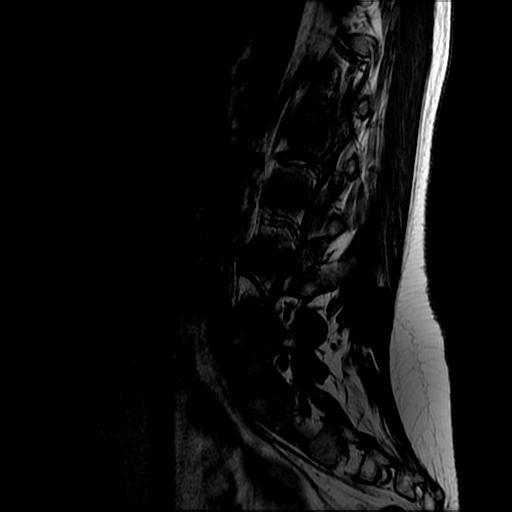
[im 7/12]
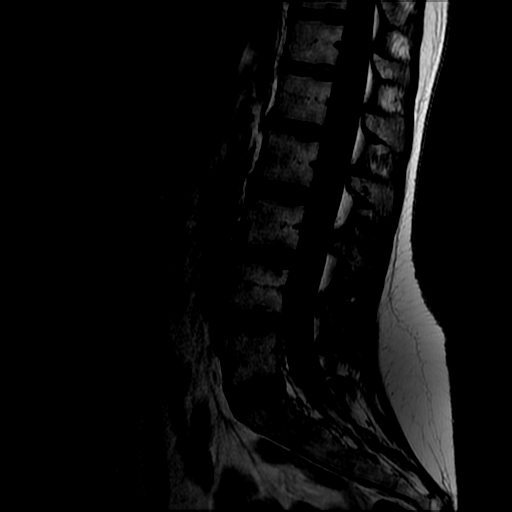
[im 12/12]
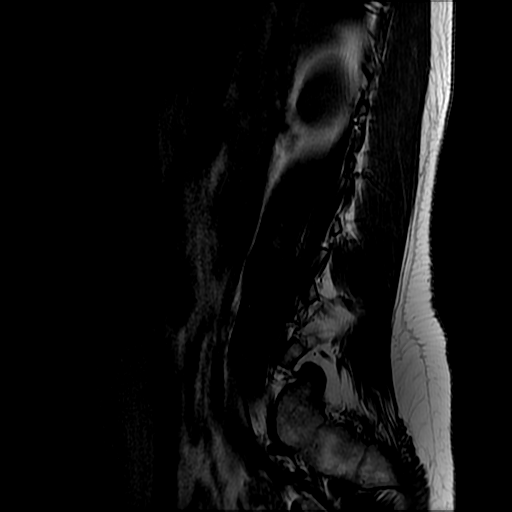

[Series 4: T2 post-contrast · sagittal · 4.0mm · 0.51mm/px · 5 of 12 slices shown]
[im 1/12]
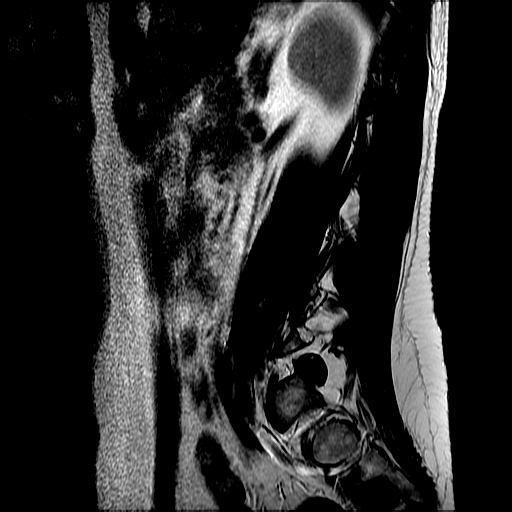
[im 3/12]
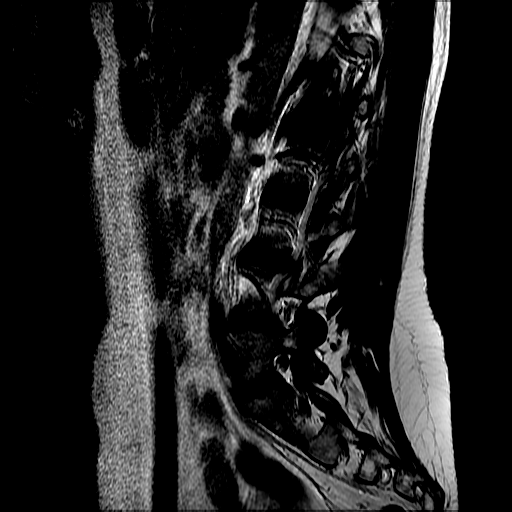
[im 6/12]
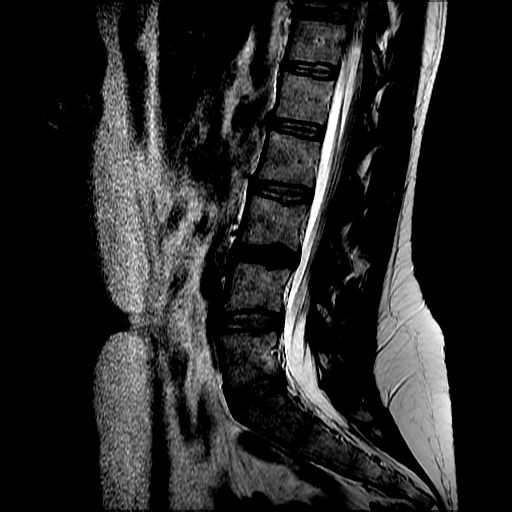
[im 9/12]
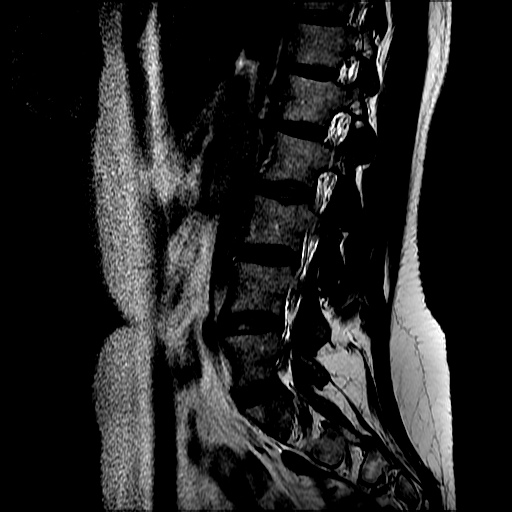
[im 12/12]
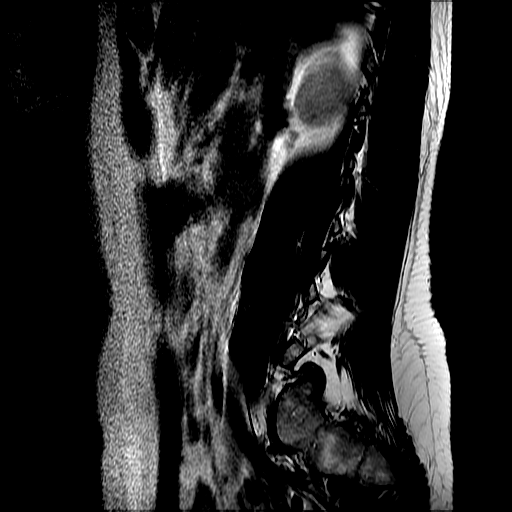

[Series 6: T2 · axial · 4.0mm · 0.39mm/px · z∈[-138,+31]mm · 8 of 37 slices shown]
[im 3/37]
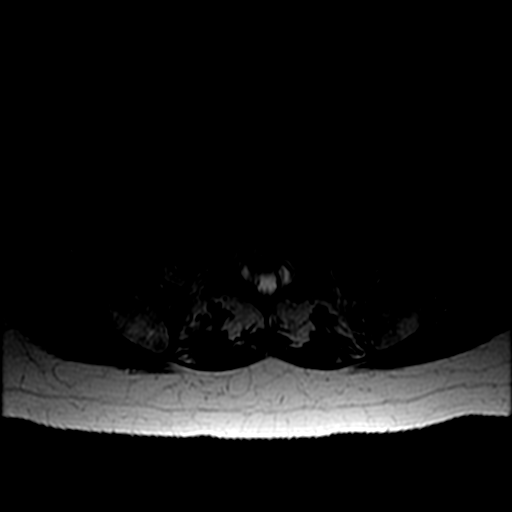
[im 5/37]
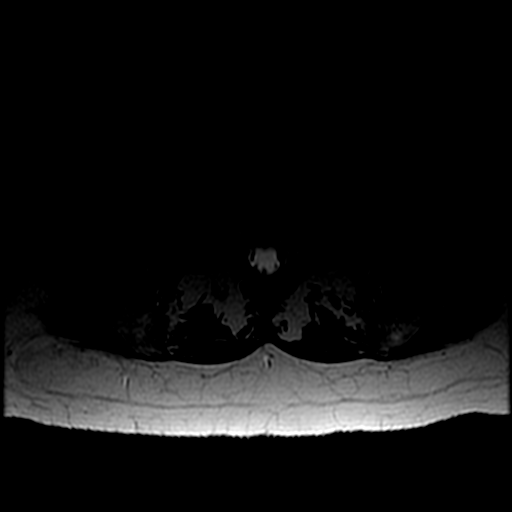
[im 8/37]
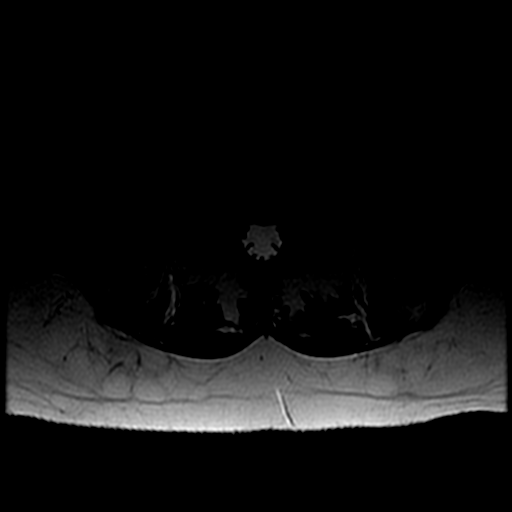
[im 13/37]
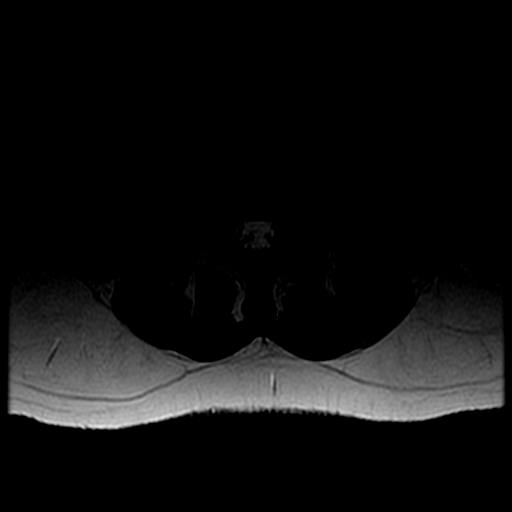
[im 17/37]
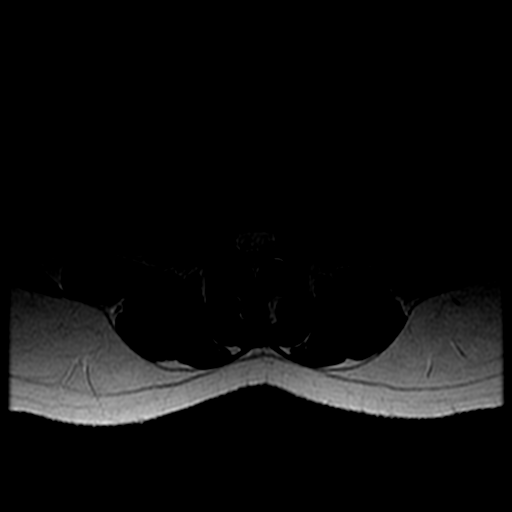
[im 20/37]
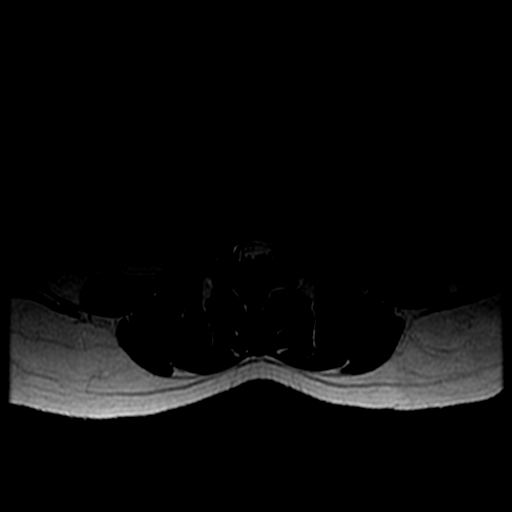
[im 22/37]
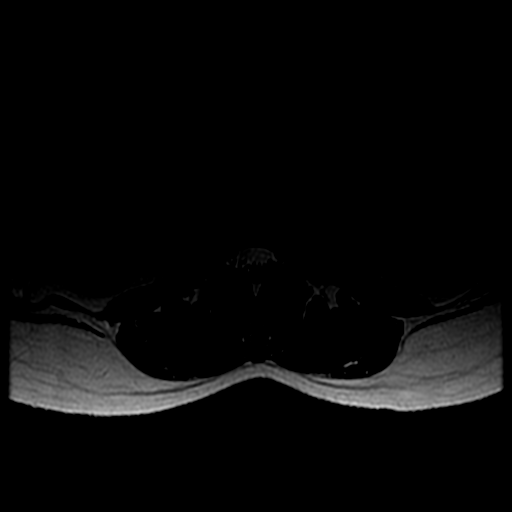
[im 32/37]
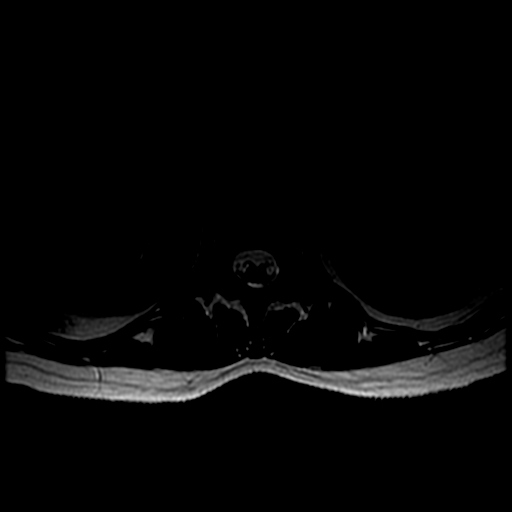

[Series 7: T1 · axial · 4.0mm · 0.39mm/px · z∈[-128,+31]mm · 3 of 37 slices shown (2 of 2)]
[im 5/37]
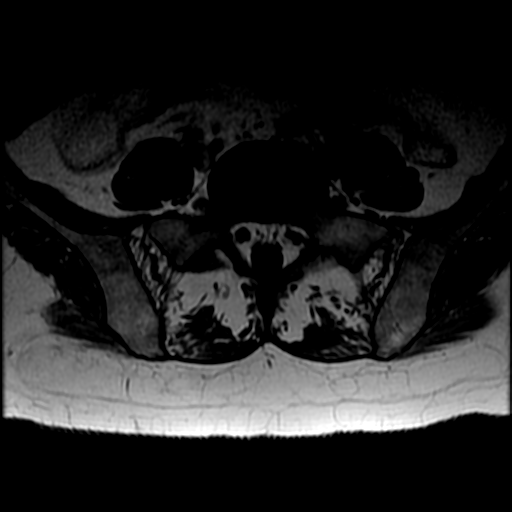
[im 20/37]
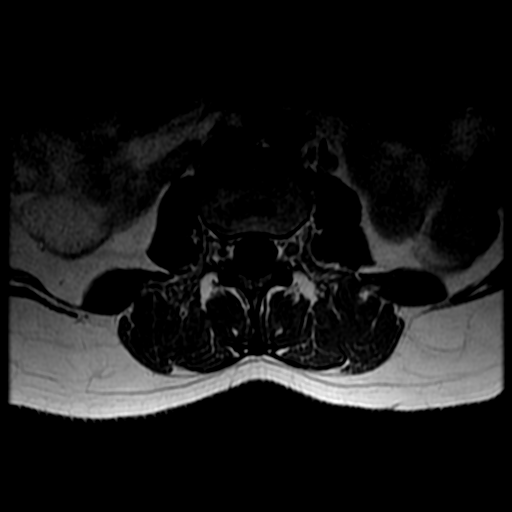
[im 32/37]
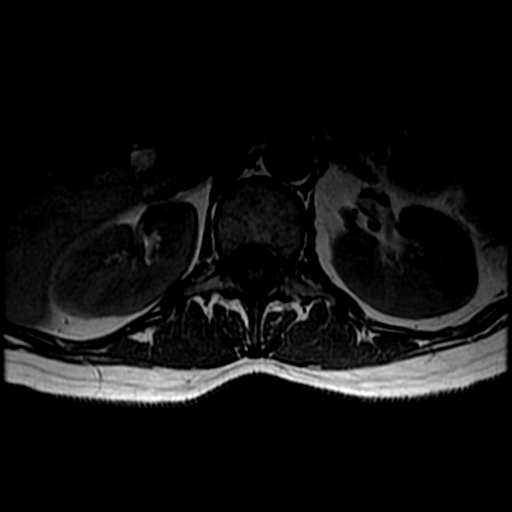

[19 of 48 positions shown; findings below may reference images not displayed]

FINDINGS: Segmentation:  5 lumbar type vertebral bodies.

Alignment:  Normal

Vertebrae:  Normal

Conus medullaris and cauda equina: Conus extends to the L2 level.
Conus and cauda equina appear normal.

Paraspinal and other soft tissues: Negative

Disc levels:

No abnormality at T12-L1, L1-2 or L2-3.

L3-4: Mild desiccation and bulging of the disc. Mild facet and
ligamentous hypertrophy. No compressive stenosis.

L4-5 and L5-S1: Normal.  No disc or facet pathology.  No stenosis.

Compared to the study of 8788, the findings are quite similar. There
is not been any significant progression of degenerative change at
L3-4.
IMPRESSION: Very similar appearance to the study of 8788. Mild degenerative
change at L3-4 with mild desiccation and bulging of the disc. Mild
facet hypertrophy. No apparent compressive stenosis. The other
levels are normal.

## 2020-04-30 NOTE — Progress Notes (Deleted)
Office Visit Note  Patient: [name]             Date of Birth: Sep 08, 1965           MRN: [id number]             PCP: Leighton Ruff, MD Referring: Leighton Ruff, MD Visit Date: 05/14/2020 Occupation: @GUAROCC @  Subjective:  No chief complaint on file.   History of Present Illness: [name] is a 55 y.o. female ***   Activities of Daily Living:  Patient reports morning stiffness for *** {minute/hour:19697}.   Patient {ACTIONS;DENIES/REPORTS:21021675::"Denies"} nocturnal pain.  Difficulty dressing/grooming: {ACTIONS;DENIES/REPORTS:21021675::"Denies"} Difficulty climbing stairs: {ACTIONS;DENIES/REPORTS:21021675::"Denies"} Difficulty getting out of chair: {ACTIONS;DENIES/REPORTS:21021675::"Denies"} Difficulty using hands for taps, buttons, cutlery, and/or writing: {ACTIONS;DENIES/REPORTS:21021675::"Denies"}  No Rheumatology ROS completed.   PMFS History:  Patient Active Problem List   Diagnosis Date Noted  . 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 Pelvic relaxation 08/01/2018  . Prolapse of female pelvic organs 08/01/2018  . Other fatigue 10/01/2016  . Primary insomnia 10/01/2016  . History of migraine 10/01/2016  . DDD (degenerative disc disease), lumbar 04/08/2016  . DDD (degenerative disc disease), cervical 04/08/2016  . Osteoarthritis, hand 04/08/2016  . Fibromyalgia   . Intervertebral disk disease   . HA (headache)     Past Medical History:  Diagnosis Date  . Anemia   . Esophageal spasm 2019  . Fibromyalgia   . Intervertebral disk disease   . Migraine   . Vaginal dysplasia     Family History  Problem Relation Age of Onset  . Alzheimer's disease Mother   . Dementia Mother   . Breast cancer Paternal Grandmother    Past Surgical History:  Procedure Laterality Date  . ANTERIOR AND POSTERIOR REPAIR WITH SACROSPINOUS FIXATION N/A 08/01/2018   Procedure: ANTERIOR REPAIR;  Surgeon: Molli Posey, MD;  Location: [facility];  Service: Gynecology;  Laterality: N/A;  . APPENDECTOMY  age 75 or 16  . CERVICAL FUSION  yrs ago   C5 to C6 stiff neck  . CESAREAN SECTION    . CO2 LASER APPLICATION N/A 05/21/9022   Procedure: CO2 LASER APPLICATION OF THE VAGINA;  Surgeon: Everitt Amber, MD;  Location: [facility];  Service: Gynecology;  Laterality: N/A;  . HEMORRHOID SURGERY    . LAPAROSCOPIC VAGINAL HYSTERECTOMY WITH SALPINGECTOMY Bilateral 08/01/2018   Procedure: LAPAROSCOPIC ASSISTED VAGINAL HYSTERECTOMY WITH SALPINGECTOMY;  Surgeon: Molli Posey, MD;  Location: [facility];  Service: Gynecology;  Laterality: Bilateral;  need bed  . NASAL SEPTUM SURGERY  2020  . TUBAL LIGATION    . UTERINE POLYPS  2019   Social History   Social History Narrative   Patient lives at home with her husband and son. She has her BS and she is not working.    Immunization History  Administered Date(s) Administered  . PFIZER(Purple Top)SARS-COV-2 Vaccination 06/07/2019, 06/28/2019     Objective: Vital Signs: LMP 01/15/2018    Physical Exam   Musculoskeletal Exam: ***  CDAI Exam: CDAI Score: -- Patient Global: --; Provider Global: -- Swollen: --; Tender: -- Joint Exam 05/14/2020   No joint exam has been documented for this visit   There is currently no information documented on the homunculus. Go to the Rheumatology activity and complete the homunculus joint exam.  Investigation: No additional findings.  Imaging: No results found.  Recent Labs: Lab Results  Component Value Date   WBC 4.8 09/19/2019   HGB 12.2  09/19/2019   PLT 288 09/19/2019   NA 139 09/19/2019   K 4.5 09/19/2019   CL 105 09/19/2019   CO2 27 09/19/2019   GLUCOSE 101 (H) 09/19/2019   BUN 16 09/19/2019   CREATININE 0.88 09/19/2019   CALCIUM 9.8 09/19/2019   GFRAA >60 09/19/2019    Speciality Comments: No specialty comments available.  Procedures:  No procedures performed Allergies:  Ibuprofen and Lyrica [pregabalin]   Assessment / Plan:     Visit Diagnoses: No diagnosis found.  Orders: No orders of the defined types were placed in this encounter.  No orders of the defined types were placed in this encounter.   Face-to-face time spent with patient was *** minutes. Greater than 50% of time was spent in counseling and coordination of care.  Follow-Up Instructions: No follow-ups on file.   Earnestine Mealing, CMA  Note - This record has been created using Editor, commissioning.  Chart creation errors have been sought, but may not always  have been located. Such creation errors do not reflect on  the standard of medical care.

## 2020-05-14 ENCOUNTER — Ambulatory Visit: Payer: BC Managed Care – PPO | Admitting: Rheumatology

## 2020-05-14 DIAGNOSIS — R5383 Other fatigue: Secondary | ICD-10-CM

## 2020-05-14 DIAGNOSIS — M797 Fibromyalgia: Secondary | ICD-10-CM

## 2020-05-14 DIAGNOSIS — M19041 Primary osteoarthritis, right hand: Secondary | ICD-10-CM

## 2020-05-14 DIAGNOSIS — Z5181 Encounter for therapeutic drug level monitoring: Secondary | ICD-10-CM

## 2020-05-14 DIAGNOSIS — G8929 Other chronic pain: Secondary | ICD-10-CM

## 2020-05-14 DIAGNOSIS — M5136 Other intervertebral disc degeneration, lumbar region: Secondary | ICD-10-CM

## 2020-05-14 DIAGNOSIS — Z8669 Personal history of other diseases of the nervous system and sense organs: Secondary | ICD-10-CM

## 2020-05-14 DIAGNOSIS — M8589 Other specified disorders of bone density and structure, multiple sites: Secondary | ICD-10-CM

## 2020-05-14 DIAGNOSIS — F5101 Primary insomnia: Secondary | ICD-10-CM

## 2020-05-14 DIAGNOSIS — M5030 Other cervical disc degeneration, unspecified cervical region: Secondary | ICD-10-CM

## 2020-05-14 DIAGNOSIS — R52 Pain, unspecified: Secondary | ICD-10-CM

## 2020-05-14 DIAGNOSIS — R413 Other amnesia: Secondary | ICD-10-CM

## 2020-05-23 DIAGNOSIS — Z0142 Encounter for cervical smear to confirm findings of recent normal smear following initial abnormal smear: Secondary | ICD-10-CM | POA: Diagnosis not present

## 2020-05-23 DIAGNOSIS — Z8742 Personal history of other diseases of the female genital tract: Secondary | ICD-10-CM | POA: Diagnosis not present

## 2020-05-23 DIAGNOSIS — D072 Carcinoma in situ of vagina: Secondary | ICD-10-CM | POA: Diagnosis not present

## 2020-12-18 DIAGNOSIS — Z1231 Encounter for screening mammogram for malignant neoplasm of breast: Secondary | ICD-10-CM | POA: Diagnosis not present

## 2020-12-18 DIAGNOSIS — Z01419 Encounter for gynecological examination (general) (routine) without abnormal findings: Secondary | ICD-10-CM | POA: Diagnosis not present

## 2020-12-18 DIAGNOSIS — Z6821 Body mass index (BMI) 21.0-21.9, adult: Secondary | ICD-10-CM | POA: Diagnosis not present

## 2021-01-01 DIAGNOSIS — Z23 Encounter for immunization: Secondary | ICD-10-CM | POA: Diagnosis not present

## 2021-02-03 DIAGNOSIS — M545 Low back pain, unspecified: Secondary | ICD-10-CM | POA: Diagnosis not present

## 2021-02-03 DIAGNOSIS — R102 Pelvic and perineal pain: Secondary | ICD-10-CM | POA: Diagnosis not present

## 2021-02-03 DIAGNOSIS — M62838 Other muscle spasm: Secondary | ICD-10-CM | POA: Diagnosis not present

## 2021-02-03 DIAGNOSIS — M6281 Muscle weakness (generalized): Secondary | ICD-10-CM | POA: Diagnosis not present

## 2021-02-19 DIAGNOSIS — M6281 Muscle weakness (generalized): Secondary | ICD-10-CM | POA: Diagnosis not present

## 2021-02-19 DIAGNOSIS — M62838 Other muscle spasm: Secondary | ICD-10-CM | POA: Diagnosis not present

## 2021-02-19 DIAGNOSIS — M545 Low back pain, unspecified: Secondary | ICD-10-CM | POA: Diagnosis not present

## 2021-02-19 DIAGNOSIS — R102 Pelvic and perineal pain: Secondary | ICD-10-CM | POA: Diagnosis not present

## 2021-03-03 DIAGNOSIS — Z1211 Encounter for screening for malignant neoplasm of colon: Secondary | ICD-10-CM | POA: Diagnosis not present

## 2021-03-03 DIAGNOSIS — K5730 Diverticulosis of large intestine without perforation or abscess without bleeding: Secondary | ICD-10-CM | POA: Diagnosis not present

## 2021-07-07 DIAGNOSIS — R7303 Prediabetes: Secondary | ICD-10-CM | POA: Diagnosis not present

## 2021-07-07 DIAGNOSIS — R1013 Epigastric pain: Secondary | ICD-10-CM | POA: Diagnosis not present

## 2021-07-07 DIAGNOSIS — K219 Gastro-esophageal reflux disease without esophagitis: Secondary | ICD-10-CM | POA: Diagnosis not present

## 2021-07-07 DIAGNOSIS — Z23 Encounter for immunization: Secondary | ICD-10-CM | POA: Diagnosis not present

## 2021-07-07 DIAGNOSIS — E782 Mixed hyperlipidemia: Secondary | ICD-10-CM | POA: Diagnosis not present

## 2021-07-22 DIAGNOSIS — K219 Gastro-esophageal reflux disease without esophagitis: Secondary | ICD-10-CM | POA: Diagnosis not present

## 2021-09-25 DIAGNOSIS — Z23 Encounter for immunization: Secondary | ICD-10-CM | POA: Diagnosis not present

## 2021-09-25 DIAGNOSIS — L299 Pruritus, unspecified: Secondary | ICD-10-CM | POA: Diagnosis not present

## 2021-11-19 DIAGNOSIS — R202 Paresthesia of skin: Secondary | ICD-10-CM | POA: Diagnosis not present

## 2021-11-19 DIAGNOSIS — L720 Epidermal cyst: Secondary | ICD-10-CM | POA: Diagnosis not present

## 2021-11-19 DIAGNOSIS — L309 Dermatitis, unspecified: Secondary | ICD-10-CM | POA: Diagnosis not present

## 2021-11-19 DIAGNOSIS — I781 Nevus, non-neoplastic: Secondary | ICD-10-CM | POA: Diagnosis not present

## 2021-11-19 DIAGNOSIS — L820 Inflamed seborrheic keratosis: Secondary | ICD-10-CM | POA: Diagnosis not present

## 2022-01-14 DIAGNOSIS — Z124 Encounter for screening for malignant neoplasm of cervix: Secondary | ICD-10-CM | POA: Diagnosis not present

## 2022-01-14 DIAGNOSIS — Z79890 Hormone replacement therapy (postmenopausal): Secondary | ICD-10-CM | POA: Diagnosis not present

## 2022-01-14 DIAGNOSIS — Z780 Asymptomatic menopausal state: Secondary | ICD-10-CM | POA: Diagnosis not present

## 2022-01-14 DIAGNOSIS — Z1151 Encounter for screening for human papillomavirus (HPV): Secondary | ICD-10-CM | POA: Diagnosis not present

## 2022-01-14 DIAGNOSIS — Z1231 Encounter for screening mammogram for malignant neoplasm of breast: Secondary | ICD-10-CM | POA: Diagnosis not present

## 2022-01-14 DIAGNOSIS — N958 Other specified menopausal and perimenopausal disorders: Secondary | ICD-10-CM | POA: Diagnosis not present

## 2022-01-14 DIAGNOSIS — Z6820 Body mass index (BMI) 20.0-20.9, adult: Secondary | ICD-10-CM | POA: Diagnosis not present

## 2022-01-14 DIAGNOSIS — Z01419 Encounter for gynecological examination (general) (routine) without abnormal findings: Secondary | ICD-10-CM | POA: Diagnosis not present

## 2022-04-16 DIAGNOSIS — L309 Dermatitis, unspecified: Secondary | ICD-10-CM | POA: Diagnosis not present

## 2022-04-16 DIAGNOSIS — L308 Other specified dermatitis: Secondary | ICD-10-CM | POA: Diagnosis not present

## 2022-05-13 DIAGNOSIS — L239 Allergic contact dermatitis, unspecified cause: Secondary | ICD-10-CM | POA: Diagnosis not present

## 2022-06-01 DIAGNOSIS — H35033 Hypertensive retinopathy, bilateral: Secondary | ICD-10-CM | POA: Diagnosis not present

## 2022-06-09 DIAGNOSIS — L239 Allergic contact dermatitis, unspecified cause: Secondary | ICD-10-CM | POA: Diagnosis not present

## 2022-07-21 DIAGNOSIS — E782 Mixed hyperlipidemia: Secondary | ICD-10-CM | POA: Diagnosis not present

## 2022-08-24 DIAGNOSIS — L239 Allergic contact dermatitis, unspecified cause: Secondary | ICD-10-CM | POA: Diagnosis not present

## 2022-08-28 DIAGNOSIS — Z5181 Encounter for therapeutic drug level monitoring: Secondary | ICD-10-CM | POA: Diagnosis not present

## 2022-08-28 DIAGNOSIS — L2089 Other atopic dermatitis: Secondary | ICD-10-CM | POA: Diagnosis not present

## 2022-10-15 DIAGNOSIS — L2089 Other atopic dermatitis: Secondary | ICD-10-CM | POA: Diagnosis not present

## 2022-11-02 DIAGNOSIS — Z6821 Body mass index (BMI) 21.0-21.9, adult: Secondary | ICD-10-CM | POA: Diagnosis not present

## 2022-11-02 DIAGNOSIS — M79671 Pain in right foot: Secondary | ICD-10-CM | POA: Diagnosis not present

## 2023-01-18 DIAGNOSIS — L209 Atopic dermatitis, unspecified: Secondary | ICD-10-CM | POA: Diagnosis not present

## 2023-01-27 DIAGNOSIS — Z124 Encounter for screening for malignant neoplasm of cervix: Secondary | ICD-10-CM | POA: Diagnosis not present

## 2023-01-27 DIAGNOSIS — Z1231 Encounter for screening mammogram for malignant neoplasm of breast: Secondary | ICD-10-CM | POA: Diagnosis not present

## 2023-01-27 DIAGNOSIS — Z1151 Encounter for screening for human papillomavirus (HPV): Secondary | ICD-10-CM | POA: Diagnosis not present

## 2023-01-27 DIAGNOSIS — Z01419 Encounter for gynecological examination (general) (routine) without abnormal findings: Secondary | ICD-10-CM | POA: Diagnosis not present

## 2023-01-27 DIAGNOSIS — Z6821 Body mass index (BMI) 21.0-21.9, adult: Secondary | ICD-10-CM | POA: Diagnosis not present

## 2023-03-24 DIAGNOSIS — R140 Abdominal distension (gaseous): Secondary | ICD-10-CM | POA: Diagnosis not present

## 2023-07-19 DIAGNOSIS — L821 Other seborrheic keratosis: Secondary | ICD-10-CM | POA: Diagnosis not present

## 2023-07-19 DIAGNOSIS — D485 Neoplasm of uncertain behavior of skin: Secondary | ICD-10-CM | POA: Diagnosis not present

## 2023-07-19 DIAGNOSIS — D229 Melanocytic nevi, unspecified: Secondary | ICD-10-CM | POA: Diagnosis not present

## 2023-07-19 DIAGNOSIS — L814 Other melanin hyperpigmentation: Secondary | ICD-10-CM | POA: Diagnosis not present

## 2023-07-19 DIAGNOSIS — L578 Other skin changes due to chronic exposure to nonionizing radiation: Secondary | ICD-10-CM | POA: Diagnosis not present

## 2023-07-21 ENCOUNTER — Other Ambulatory Visit (HOSPITAL_COMMUNITY): Payer: Self-pay | Admitting: Family Medicine

## 2023-07-21 DIAGNOSIS — E782 Mixed hyperlipidemia: Secondary | ICD-10-CM | POA: Diagnosis not present

## 2023-07-21 DIAGNOSIS — Z23 Encounter for immunization: Secondary | ICD-10-CM | POA: Diagnosis not present

## 2023-07-21 DIAGNOSIS — L309 Dermatitis, unspecified: Secondary | ICD-10-CM | POA: Diagnosis not present

## 2023-08-30 ENCOUNTER — Encounter (HOSPITAL_COMMUNITY): Payer: Self-pay

## 2023-08-30 ENCOUNTER — Ambulatory Visit (HOSPITAL_COMMUNITY)
Admission: RE | Admit: 2023-08-30 | Discharge: 2023-08-30 | Disposition: A | Discharge status: Home / Self Care | Payer: Self-pay | Source: Ambulatory Visit | Attending: Family Medicine | Admitting: Family Medicine

## 2023-08-30 DIAGNOSIS — E782 Mixed hyperlipidemia: Secondary | ICD-10-CM | POA: Insufficient documentation

## 2023-12-30 DIAGNOSIS — L820 Inflamed seborrheic keratosis: Secondary | ICD-10-CM | POA: Diagnosis not present

## 2023-12-30 DIAGNOSIS — L209 Atopic dermatitis, unspecified: Secondary | ICD-10-CM | POA: Diagnosis not present
